# Patient Record
Sex: Male | Born: 1960
Health system: Southern US, Community
[De-identification: ages and names within clinical notes are randomized; demographics above are authoritative.]

## PROBLEM LIST (undated history)

## (undated) DIAGNOSIS — L739 Follicular disorder, unspecified: Secondary | ICD-10-CM

## (undated) DIAGNOSIS — G43009 Migraine without aura, not intractable, without status migrainosus: Secondary | ICD-10-CM

## (undated) DIAGNOSIS — N529 Male erectile dysfunction, unspecified: Secondary | ICD-10-CM

## (undated) DIAGNOSIS — F325 Major depressive disorder, single episode, in full remission: Secondary | ICD-10-CM

## (undated) DIAGNOSIS — K635 Polyp of colon: Secondary | ICD-10-CM

## (undated) DIAGNOSIS — G4700 Insomnia, unspecified: Secondary | ICD-10-CM

## (undated) DIAGNOSIS — F411 Generalized anxiety disorder: Secondary | ICD-10-CM

## (undated) DIAGNOSIS — E782 Mixed hyperlipidemia: Secondary | ICD-10-CM

## (undated) DIAGNOSIS — J302 Other seasonal allergic rhinitis: Principal | ICD-10-CM

## (undated) DIAGNOSIS — Z131 Encounter for screening for diabetes mellitus: Secondary | ICD-10-CM

## (undated) DIAGNOSIS — M545 Low back pain: Secondary | ICD-10-CM

## (undated) DIAGNOSIS — K5790 Diverticulosis of intestine, part unspecified, without perforation or abscess without bleeding: Secondary | ICD-10-CM

## (undated) DIAGNOSIS — R0683 Snoring: Secondary | ICD-10-CM

## (undated) DIAGNOSIS — N401 Enlarged prostate with lower urinary tract symptoms: Secondary | ICD-10-CM

## (undated) DIAGNOSIS — R6882 Decreased libido: Secondary | ICD-10-CM

## (undated) DIAGNOSIS — F419 Anxiety disorder, unspecified: Secondary | ICD-10-CM

## (undated) DIAGNOSIS — E7800 Pure hypercholesterolemia, unspecified: Secondary | ICD-10-CM

## (undated) HISTORY — DX: Snoring: R06.83

## (undated) HISTORY — DX: Migraine without aura, not intractable, without status migrainosus: G43.009

## (undated) HISTORY — DX: Mixed hyperlipidemia: E78.2

## (undated) HISTORY — DX: Follicular disorder, unspecified: L73.9

## (undated) HISTORY — DX: Pure hypercholesterolemia, unspecified: E78.00

## (undated) HISTORY — DX: Major depressive disorder, single episode, in full remission: F32.5

## (undated) HISTORY — DX: Encounter for screening for diabetes mellitus: Z13.1

## (undated) HISTORY — DX: Decreased libido: R68.82

## (undated) HISTORY — DX: Insomnia, unspecified: G47.00

## (undated) HISTORY — DX: Other seasonal allergic rhinitis: J30.2

## (undated) HISTORY — DX: Anxiety disorder, unspecified: F41.9

## (undated) HISTORY — DX: Benign prostatic hyperplasia with lower urinary tract symptoms: N40.1

## (undated) HISTORY — DX: Polyp of colon: K63.5

## (undated) HISTORY — DX: Generalized anxiety disorder: F41.1

## (undated) HISTORY — DX: Diverticulosis of intestine, part unspecified, without perforation or abscess without bleeding: K57.90

## (undated) HISTORY — DX: Low back pain: M54.5

## (undated) HISTORY — DX: Male erectile dysfunction, unspecified: N52.9

---

## 2008-11-07 ENCOUNTER — Encounter: Admission: RE | Admit: 2008-11-07 | Discharge: 2008-11-07 | Payer: Self-pay | Admitting: Emergency Medicine

## 2010-09-26 ENCOUNTER — Encounter: Payer: Self-pay | Admitting: Emergency Medicine

## 2010-10-26 ENCOUNTER — Ambulatory Visit: Payer: Self-pay

## 2015-06-01 DIAGNOSIS — K5790 Diverticulosis of intestine, part unspecified, without perforation or abscess without bleeding: Secondary | ICD-10-CM

## 2015-06-01 DIAGNOSIS — L739 Follicular disorder, unspecified: Secondary | ICD-10-CM

## 2015-06-01 DIAGNOSIS — N529 Male erectile dysfunction, unspecified: Secondary | ICD-10-CM

## 2015-06-01 DIAGNOSIS — R6882 Decreased libido: Secondary | ICD-10-CM

## 2015-06-01 DIAGNOSIS — M545 Low back pain, unspecified: Secondary | ICD-10-CM

## 2015-06-01 DIAGNOSIS — G4700 Insomnia, unspecified: Secondary | ICD-10-CM

## 2015-06-01 DIAGNOSIS — F411 Generalized anxiety disorder: Secondary | ICD-10-CM

## 2015-06-01 DIAGNOSIS — G43009 Migraine without aura, not intractable, without status migrainosus: Secondary | ICD-10-CM | POA: Insufficient documentation

## 2015-06-01 DIAGNOSIS — K635 Polyp of colon: Secondary | ICD-10-CM

## 2015-06-01 DIAGNOSIS — F419 Anxiety disorder, unspecified: Secondary | ICD-10-CM

## 2015-06-01 DIAGNOSIS — E7800 Pure hypercholesterolemia, unspecified: Secondary | ICD-10-CM

## 2015-06-01 DIAGNOSIS — N401 Enlarged prostate with lower urinary tract symptoms: Secondary | ICD-10-CM

## 2015-06-01 DIAGNOSIS — G43909 Migraine, unspecified, not intractable, without status migrainosus: Secondary | ICD-10-CM

## 2015-06-01 DIAGNOSIS — R0683 Snoring: Secondary | ICD-10-CM | POA: Insufficient documentation

## 2015-06-01 DIAGNOSIS — F325 Major depressive disorder, single episode, in full remission: Secondary | ICD-10-CM | POA: Insufficient documentation

## 2015-06-01 DIAGNOSIS — J302 Other seasonal allergic rhinitis: Secondary | ICD-10-CM

## 2015-06-01 DIAGNOSIS — E782 Mixed hyperlipidemia: Secondary | ICD-10-CM

## 2015-06-01 DIAGNOSIS — Z131 Encounter for screening for diabetes mellitus: Secondary | ICD-10-CM | POA: Insufficient documentation

## 2015-06-01 HISTORY — DX: Decreased libido: R68.82

## 2015-06-01 HISTORY — DX: Diverticulosis of intestine, part unspecified, without perforation or abscess without bleeding: K57.90

## 2015-06-01 HISTORY — DX: Other seasonal allergic rhinitis: J30.2

## 2015-06-01 HISTORY — DX: Male erectile dysfunction, unspecified: N52.9

## 2015-06-01 HISTORY — DX: Anxiety disorder, unspecified: F41.9

## 2015-06-01 HISTORY — DX: Polyp of colon: K63.5

## 2015-06-01 HISTORY — DX: Low back pain, unspecified: M54.50

## 2015-06-01 HISTORY — DX: Pure hypercholesterolemia, unspecified: E78.00

## 2015-06-01 HISTORY — DX: Generalized anxiety disorder: F41.1

## 2015-06-04 ENCOUNTER — Ambulatory Visit (INDEPENDENT_AMBULATORY_CARE_PROVIDER_SITE_OTHER): Payer: BLUE CROSS/BLUE SHIELD | Admitting: Internal Medicine

## 2015-06-04 ENCOUNTER — Encounter: Payer: Self-pay | Admitting: Internal Medicine

## 2015-06-04 VITALS — BP 108/84 | HR 53 | Ht 66.0 in | Wt 150.0 lb

## 2015-06-04 DIAGNOSIS — R001 Bradycardia, unspecified: Secondary | ICD-10-CM

## 2015-06-04 NOTE — Progress Notes (Signed)
HPI Tyler Hickman is referred today for evaluation of bradycardia. He is a pleasant middle aged man with a h/o migraine HA's and nausea from a pronounced gag reflex. He has otherwise had good health. He has been known at time to be bradycardic in the doctors office. He has never had syncope. He has seen a trainer and noted to have relative bradycardia on a 3 min step test. The patient exercises over 5 times a week, walking on the treadmill and 4 mph with an incline. He tolerates this well. He has no family history of syncope. His brother has sleep apnea. He takes his pulse on occaision and has caught it under 50/min. No other complaints. His migraines have been treated with liquid ibuprofen and Zofran. No Known Allergies   Current Outpatient Prescriptions  Medication Sig Dispense Refill  . ALPRAZolam (XANAX) 0.5 MG tablet Take 0.5 mg by mouth 2 (two) times daily as needed for anxiety (sleep).     Marland Kitchen buPROPion (WELLBUTRIN XL) 150 MG 24 hr tablet Take 300 mg by mouth daily.     . Calcium Carbonate (CALCIUM 600 PO) Take 2 tablets by mouth daily.    . cetirizine (ZYRTEC) 10 MG tablet Take 10 mg by mouth daily.    . Chlorpheniramine-DM (ROBITUSSIN COUGH/COLD LONG-ACT) 2-15 MG/5ML LIQD Take by mouth as directed. Take by mouth as directed    . cyclobenzaprine (FLEXERIL) 10 MG tablet Take 10 mg by mouth 3 (three) times daily as needed for muscle spasms (muscle spasms).    . docusate sodium (COLACE) 100 MG capsule Take 100 mg by mouth 2 (two) times daily.    . fluticasone (FLONASE) 50 MCG/ACT nasal spray Place 2 sprays into both nostrils daily.    Marland Kitchen guaiFENesin (MUCINEX) 600 MG 12 hr tablet Take 1,200 mg by mouth 2 (two) times daily as needed (cough).    Marland Kitchen ibuprofen (ADVIL,MOTRIN) 100 MG/5ML suspension Take 200 mg by mouth every 8 (eight) hours as needed (migraine).    Marland Kitchen ketotifen (ALAWAY) 0.025 % ophthalmic solution Apply 1 drop to eye 2 (two) times daily.    . montelukast (SINGULAIR) 10 MG tablet  Take 10 mg by mouth at bedtime.    . Multiple Vitamin (MULTI VITAMIN DAILY PO) Take 2 tablets by mouth daily.    . naproxen sodium (ANAPROX) 220 MG tablet Take 220 mg by mouth 2 (two) times daily as needed (pain).    . ondansetron (ZOFRAN) 4 MG tablet Take 4 mg by mouth every 8 (eight) hours as needed for nausea or vomiting (migraine nausea).    Marland Kitchen oxymetazoline (AFRIN) 0.05 % nasal spray Place 3 sprays into both nostrils 2 (two) times daily as needed for congestion (congestion).    . polyethylene glycol (MIRALAX / GLYCOLAX) packet Take 17 g by mouth daily as needed (constipation).    . pseudoephedrine (SUDAFED) 30 MG tablet Take 30 mg by mouth every 6 (six) hours as needed for congestion (congestion).    . sildenafil (VIAGRA) 50 MG tablet Take 50 mg by mouth daily as needed for erectile dysfunction (erectile dysfunction).    . Sodium Chloride-Sodium Bicarb (AYR SALINE NASAL RINSE) 1.57 G PACK Place into the nose daily as needed (nasal irrigation).    . zaleplon (SONATA) 10 MG capsule Take 10 mg by mouth at bedtime as needed for sleep (insomnia of midnight awakenings).     No current facility-administered medications for this visit.     Past Medical History  Diagnosis Date  .  Seasonal allergic rhinitis 06/01/2015  . Elevated cholesterol 06/01/2015  . Recurrent low back pain 06/01/2015  . Low libido 06/01/2015  . Erectile dysfunction 06/01/2015  . Low libido 06/01/2015  . GAD (generalized anxiety disorder) 06/01/2015  . Anxiety 06/01/2015  . Diverticulosis 06/01/2015  . Hyperplastic colon polyp 06/01/2015  . Mixed hyperlipidemia   . Enlarged prostate with lower urinary tract symptoms (LUTS)   . Migraine without aura and without status migrainosus, not intractable   . Insomnia   . Major depressive disorder in remission   . Snoring   . Folliculitis   . Screening for diabetes mellitus     ROS:   All systems reviewed and negative except as noted in the HPI.   No past surgical history on  file.   Family History  Problem Relation Age of Onset  . Ovarian cancer Mother   . Breast cancer Mother   . Hypertension Brother   . Sleep apnea Brother   . Healthy Daughter   . Healthy Son   . Brain cancer Father      Social History   Social History  . Marital Status: Married    Spouse Name: N/A  . Number of Children: N/A  . Years of Education: N/A   Occupational History  . Not on file.   Social History Main Topics  . Smoking status: Never Smoker   . Smokeless tobacco: Never Used  . Alcohol Use: 0.6 oz/week    1 Glasses of wine, 0 Cans of beer per week  . Drug Use: No  . Sexual Activity: Not on file   Other Topics Concern  . Not on file   Social History Narrative     BP 108/84 mmHg  Pulse 53  Ht  (1.676 m)  Wt 150 lb (68.04 kg)  BMI 24.22 kg/m2  SpO2 97%  Physical Exam:  Well appearing middle aged man, NAD HEENT: Unremarkable Neck:  7 cm JVD, no thyromegally Lymphatics:  No adenopathy Back:  No CVA tenderness Lungs:  Clear with no wheezes HEART:  Regular rate rhythm, no murmurs, no rubs, no clicks Abd:  soft, positive bowel sounds, no organomegally, no rebound, no guarding Ext:  2 plus pulses, no edema, no cyanosis, no clubbing Skin:  No rashes no nodules Neuro:  CN II through XII intact, motor grossly intact  EKG - nsr   Assess/Plan:

## 2015-06-04 NOTE — Patient Instructions (Signed)
Medication Instructions:  Your physician recommends that you continue on your current medications as directed. Please refer to the Current Medication list given to you today.   Labwork: None ordered  Testing/Procedures: None ordered  Follow-Up: Your physician recommends that you schedule a follow-up appointment as needed with Dr Taylor   Any Other Special Instructions Will Be Listed Below (If Applicable).   

## 2015-06-04 NOTE — Assessment & Plan Note (Signed)
He appears to be asymptomatic at this time. I have discussed the benign nature of his sinus bradycardia. I have encouraged him to continue his vigorous physical activity. No indication for PPM insertion at this point in time.

## 2015-06-26 ENCOUNTER — Encounter: Payer: Self-pay | Admitting: Internal Medicine

## 2015-07-01 ENCOUNTER — Ambulatory Visit: Payer: Self-pay | Admitting: Cardiology

## 2015-12-22 DIAGNOSIS — E7800 Pure hypercholesterolemia, unspecified: Secondary | ICD-10-CM | POA: Diagnosis not present

## 2015-12-22 DIAGNOSIS — Z0000 Encounter for general adult medical examination without abnormal findings: Secondary | ICD-10-CM | POA: Diagnosis not present

## 2015-12-22 DIAGNOSIS — Z125 Encounter for screening for malignant neoplasm of prostate: Secondary | ICD-10-CM | POA: Diagnosis not present

## 2015-12-22 DIAGNOSIS — G43009 Migraine without aura, not intractable, without status migrainosus: Secondary | ICD-10-CM | POA: Diagnosis not present

## 2015-12-24 DIAGNOSIS — F329 Major depressive disorder, single episode, unspecified: Secondary | ICD-10-CM | POA: Diagnosis not present

## 2016-02-05 DIAGNOSIS — J029 Acute pharyngitis, unspecified: Secondary | ICD-10-CM | POA: Diagnosis not present

## 2016-02-05 DIAGNOSIS — L659 Nonscarring hair loss, unspecified: Secondary | ICD-10-CM | POA: Diagnosis not present

## 2016-02-18 DIAGNOSIS — F329 Major depressive disorder, single episode, unspecified: Secondary | ICD-10-CM | POA: Diagnosis not present

## 2016-02-19 DIAGNOSIS — Z91018 Allergy to other foods: Secondary | ICD-10-CM | POA: Diagnosis not present

## 2016-02-19 DIAGNOSIS — J309 Allergic rhinitis, unspecified: Secondary | ICD-10-CM | POA: Diagnosis not present

## 2016-02-19 DIAGNOSIS — H1010 Acute atopic conjunctivitis, unspecified eye: Secondary | ICD-10-CM | POA: Diagnosis not present

## 2016-02-19 DIAGNOSIS — L209 Atopic dermatitis, unspecified: Secondary | ICD-10-CM | POA: Diagnosis not present

## 2016-03-29 DIAGNOSIS — F330 Major depressive disorder, recurrent, mild: Secondary | ICD-10-CM | POA: Diagnosis not present

## 2016-04-05 DIAGNOSIS — F330 Major depressive disorder, recurrent, mild: Secondary | ICD-10-CM | POA: Diagnosis not present

## 2016-04-20 DIAGNOSIS — F330 Major depressive disorder, recurrent, mild: Secondary | ICD-10-CM | POA: Diagnosis not present

## 2016-04-28 DIAGNOSIS — H5213 Myopia, bilateral: Secondary | ICD-10-CM | POA: Diagnosis not present

## 2016-05-02 DIAGNOSIS — F411 Generalized anxiety disorder: Secondary | ICD-10-CM | POA: Diagnosis not present

## 2016-05-02 DIAGNOSIS — F330 Major depressive disorder, recurrent, mild: Secondary | ICD-10-CM | POA: Diagnosis not present

## 2016-05-16 DIAGNOSIS — F411 Generalized anxiety disorder: Secondary | ICD-10-CM | POA: Diagnosis not present

## 2016-05-16 DIAGNOSIS — F330 Major depressive disorder, recurrent, mild: Secondary | ICD-10-CM | POA: Diagnosis not present

## 2016-05-23 DIAGNOSIS — H5213 Myopia, bilateral: Secondary | ICD-10-CM | POA: Diagnosis not present

## 2016-05-30 DIAGNOSIS — F330 Major depressive disorder, recurrent, mild: Secondary | ICD-10-CM | POA: Diagnosis not present

## 2016-05-30 DIAGNOSIS — F411 Generalized anxiety disorder: Secondary | ICD-10-CM | POA: Diagnosis not present

## 2016-06-06 DIAGNOSIS — F411 Generalized anxiety disorder: Secondary | ICD-10-CM | POA: Diagnosis not present

## 2016-06-06 DIAGNOSIS — F330 Major depressive disorder, recurrent, mild: Secondary | ICD-10-CM | POA: Diagnosis not present

## 2016-06-15 DIAGNOSIS — F330 Major depressive disorder, recurrent, mild: Secondary | ICD-10-CM | POA: Diagnosis not present

## 2016-06-15 DIAGNOSIS — F411 Generalized anxiety disorder: Secondary | ICD-10-CM | POA: Diagnosis not present

## 2016-06-23 DIAGNOSIS — F330 Major depressive disorder, recurrent, mild: Secondary | ICD-10-CM | POA: Diagnosis not present

## 2016-06-23 DIAGNOSIS — F411 Generalized anxiety disorder: Secondary | ICD-10-CM | POA: Diagnosis not present

## 2016-06-24 DIAGNOSIS — Z125 Encounter for screening for malignant neoplasm of prostate: Secondary | ICD-10-CM | POA: Diagnosis not present

## 2016-06-24 DIAGNOSIS — F430 Acute stress reaction: Secondary | ICD-10-CM | POA: Diagnosis not present

## 2016-06-24 DIAGNOSIS — E7800 Pure hypercholesterolemia, unspecified: Secondary | ICD-10-CM | POA: Diagnosis not present

## 2016-06-24 DIAGNOSIS — F329 Major depressive disorder, single episode, unspecified: Secondary | ICD-10-CM | POA: Diagnosis not present

## 2016-06-24 DIAGNOSIS — Z23 Encounter for immunization: Secondary | ICD-10-CM | POA: Diagnosis not present

## 2016-06-24 DIAGNOSIS — N529 Male erectile dysfunction, unspecified: Secondary | ICD-10-CM | POA: Diagnosis not present

## 2016-07-04 DIAGNOSIS — F411 Generalized anxiety disorder: Secondary | ICD-10-CM | POA: Diagnosis not present

## 2016-07-04 DIAGNOSIS — F330 Major depressive disorder, recurrent, mild: Secondary | ICD-10-CM | POA: Diagnosis not present

## 2016-08-03 DIAGNOSIS — J069 Acute upper respiratory infection, unspecified: Secondary | ICD-10-CM | POA: Diagnosis not present

## 2016-08-18 DIAGNOSIS — F330 Major depressive disorder, recurrent, mild: Secondary | ICD-10-CM | POA: Diagnosis not present

## 2016-08-24 DIAGNOSIS — F330 Major depressive disorder, recurrent, mild: Secondary | ICD-10-CM | POA: Diagnosis not present

## 2016-09-01 DIAGNOSIS — F330 Major depressive disorder, recurrent, mild: Secondary | ICD-10-CM | POA: Diagnosis not present

## 2016-09-12 DIAGNOSIS — F330 Major depressive disorder, recurrent, mild: Secondary | ICD-10-CM | POA: Diagnosis not present

## 2016-09-19 DIAGNOSIS — F330 Major depressive disorder, recurrent, mild: Secondary | ICD-10-CM | POA: Diagnosis not present

## 2016-09-28 DIAGNOSIS — F330 Major depressive disorder, recurrent, mild: Secondary | ICD-10-CM | POA: Diagnosis not present

## 2016-10-04 DIAGNOSIS — F330 Major depressive disorder, recurrent, mild: Secondary | ICD-10-CM | POA: Diagnosis not present

## 2016-10-24 DIAGNOSIS — F330 Major depressive disorder, recurrent, mild: Secondary | ICD-10-CM | POA: Diagnosis not present

## 2016-10-31 DIAGNOSIS — F330 Major depressive disorder, recurrent, mild: Secondary | ICD-10-CM | POA: Diagnosis not present

## 2016-11-15 DIAGNOSIS — F330 Major depressive disorder, recurrent, mild: Secondary | ICD-10-CM | POA: Diagnosis not present

## 2016-11-22 DIAGNOSIS — F330 Major depressive disorder, recurrent, mild: Secondary | ICD-10-CM | POA: Diagnosis not present

## 2016-11-30 DIAGNOSIS — F330 Major depressive disorder, recurrent, mild: Secondary | ICD-10-CM | POA: Diagnosis not present

## 2016-12-12 DIAGNOSIS — F330 Major depressive disorder, recurrent, mild: Secondary | ICD-10-CM | POA: Diagnosis not present

## 2016-12-19 DIAGNOSIS — F330 Major depressive disorder, recurrent, mild: Secondary | ICD-10-CM | POA: Diagnosis not present

## 2016-12-26 DIAGNOSIS — G4700 Insomnia, unspecified: Secondary | ICD-10-CM | POA: Diagnosis not present

## 2016-12-26 DIAGNOSIS — F329 Major depressive disorder, single episode, unspecified: Secondary | ICD-10-CM | POA: Diagnosis not present

## 2016-12-26 DIAGNOSIS — G43009 Migraine without aura, not intractable, without status migrainosus: Secondary | ICD-10-CM | POA: Diagnosis not present

## 2016-12-26 DIAGNOSIS — Z125 Encounter for screening for malignant neoplasm of prostate: Secondary | ICD-10-CM | POA: Diagnosis not present

## 2016-12-26 DIAGNOSIS — N529 Male erectile dysfunction, unspecified: Secondary | ICD-10-CM | POA: Diagnosis not present

## 2016-12-26 DIAGNOSIS — Z0000 Encounter for general adult medical examination without abnormal findings: Secondary | ICD-10-CM | POA: Diagnosis not present

## 2016-12-26 DIAGNOSIS — E7800 Pure hypercholesterolemia, unspecified: Secondary | ICD-10-CM | POA: Diagnosis not present

## 2017-01-02 DIAGNOSIS — F330 Major depressive disorder, recurrent, mild: Secondary | ICD-10-CM | POA: Diagnosis not present

## 2017-01-09 DIAGNOSIS — F330 Major depressive disorder, recurrent, mild: Secondary | ICD-10-CM | POA: Diagnosis not present

## 2017-01-23 DIAGNOSIS — F330 Major depressive disorder, recurrent, mild: Secondary | ICD-10-CM | POA: Diagnosis not present

## 2017-02-06 DIAGNOSIS — F330 Major depressive disorder, recurrent, mild: Secondary | ICD-10-CM | POA: Diagnosis not present

## 2017-02-19 ENCOUNTER — Emergency Department (HOSPITAL_COMMUNITY): Payer: BLUE CROSS/BLUE SHIELD

## 2017-02-19 ENCOUNTER — Encounter (HOSPITAL_COMMUNITY): Payer: Self-pay | Admitting: Emergency Medicine

## 2017-02-19 ENCOUNTER — Emergency Department (HOSPITAL_COMMUNITY)
Admission: EM | Admit: 2017-02-19 | Discharge: 2017-02-19 | Disposition: A | Discharge status: Home / Self Care | Payer: BLUE CROSS/BLUE SHIELD | Attending: Emergency Medicine | Admitting: Emergency Medicine

## 2017-02-19 DIAGNOSIS — K5792 Diverticulitis of intestine, part unspecified, without perforation or abscess without bleeding: Secondary | ICD-10-CM | POA: Diagnosis not present

## 2017-02-19 DIAGNOSIS — K5732 Diverticulitis of large intestine without perforation or abscess without bleeding: Secondary | ICD-10-CM | POA: Diagnosis not present

## 2017-02-19 DIAGNOSIS — R1030 Lower abdominal pain, unspecified: Secondary | ICD-10-CM | POA: Diagnosis not present

## 2017-02-19 LAB — COMPREHENSIVE METABOLIC PANEL
ALBUMIN: 3.9 g/dL (ref 3.5–5.0)
ALT: 24 U/L (ref 17–63)
ANION GAP: 10 (ref 5–15)
AST: 28 U/L (ref 15–41)
Alkaline Phosphatase: 76 U/L (ref 38–126)
BUN: 15 mg/dL (ref 6–20)
CALCIUM: 10.1 mg/dL (ref 8.9–10.3)
CHLORIDE: 105 mmol/L (ref 101–111)
CO2: 22 mmol/L (ref 22–32)
Creatinine, Ser: 1.02 mg/dL (ref 0.61–1.24)
GFR calc non Af Amer: >60 mL/min (ref >60)
GLUCOSE: 113 mg/dL — AB (ref 65–99)
POTASSIUM: 3.8 mmol/L (ref 3.5–5.1)
SODIUM: 137 mmol/L (ref 135–145)
Total Bilirubin: 0.7 mg/dL (ref 0.3–1.2)
Total Protein: 6.6 g/dL (ref 6.5–8.1)

## 2017-02-19 LAB — CBC
HEMATOCRIT: 44.5 % (ref 39.0–52.0)
HEMOGLOBIN: 15.5 g/dL (ref 13.0–17.0)
MCH: 32.4 pg (ref 26.0–34.0)
MCHC: 34.8 g/dL (ref 30.0–36.0)
MCV: 92.9 fL (ref 78.0–100.0)
Platelets: 178 10*3/uL (ref 150–400)
RBC: 4.79 MIL/uL (ref 4.22–5.81)
RDW: 12.4 % (ref 11.5–15.5)
WBC: 11.8 10*3/uL — ABNORMAL HIGH (ref 4.0–10.5)

## 2017-02-19 LAB — URINALYSIS, ROUTINE W REFLEX MICROSCOPIC
Bilirubin Urine: NEGATIVE
Glucose, UA: NEGATIVE mg/dL
HGB URINE DIPSTICK: NEGATIVE
Ketones, ur: NEGATIVE mg/dL
Leukocytes, UA: NEGATIVE
Nitrite: NEGATIVE
PH: 7 (ref 5.0–8.0)
Protein, ur: NEGATIVE mg/dL
SPECIFIC GRAVITY, URINE: 1.043 — AB (ref 1.005–1.030)

## 2017-02-19 LAB — LIPASE, BLOOD: LIPASE: 38 U/L (ref 11–51)

## 2017-02-19 MED ORDER — MORPHINE SULFATE (PF) 4 MG/ML IV SOLN
4.0000 mg | Freq: Once | INTRAVENOUS | Status: AC
  Administered 2017-02-19: 4 mg via INTRAVENOUS
  Filled 2017-02-19: qty 1

## 2017-02-19 MED ORDER — OXYCODONE-ACETAMINOPHEN 5-325 MG PO TABS: 1.0000 | ORAL_TABLET | ORAL | 0 refills | Status: DC | PRN

## 2017-02-19 MED ORDER — IOPAMIDOL (ISOVUE-300) INJECTION 61%
INTRAVENOUS | Status: AC
  Administered 2017-02-19: 100 mL
  Filled 2017-02-19: qty 100

## 2017-02-19 MED ORDER — METRONIDAZOLE 500 MG PO TABS
500.0000 mg | ORAL_TABLET | Freq: Once | ORAL | Status: AC
  Administered 2017-02-19: 500 mg via ORAL
  Filled 2017-02-19: qty 1

## 2017-02-19 MED ORDER — CIPROFLOXACIN HCL 500 MG PO TABS
500.0000 mg | ORAL_TABLET | Freq: Once | ORAL | Status: AC
  Administered 2017-02-19: 500 mg via ORAL
  Filled 2017-02-19: qty 1

## 2017-02-19 MED ORDER — METRONIDAZOLE 500 MG PO TABS: 500.0000 mg | ORAL_TABLET | Freq: Three times a day (TID) | ORAL | 0 refills | Status: AC

## 2017-02-19 MED ORDER — ONDANSETRON HCL 4 MG/2ML IJ SOLN
4.0000 mg | Freq: Once | INTRAMUSCULAR | Status: AC
  Administered 2017-02-19: 4 mg via INTRAVENOUS
  Filled 2017-02-19: qty 2

## 2017-02-19 MED ORDER — CIPROFLOXACIN HCL 500 MG PO TABS: 500.0000 mg | ORAL_TABLET | Freq: Two times a day (BID) | ORAL | 0 refills | Status: AC

## 2017-02-19 NOTE — ED Notes (Signed)
Patient states that he cannot keep anything down.  Feels like this might be the start of a migraine but has only belly pain.

## 2017-02-19 NOTE — ED Provider Notes (Signed)
[facility]-EMERGENCY DEPT Provider Note   CSN: [id number] Arrival date & time: 02/19/17  1143     History   Chief Complaint Chief Complaint  Patient presents with  . Abdominal Pain  . Nausea    HPI   Blood pressure 114/74, pulse (!) 58, temperature 98.2 F (36.8 C), resp. rate (!) 25, height 5' 5.5" (1.664 m), weight 68 kg (150 lb), SpO2 100 %.  Tyler Hickman is a 56 y.o. male complaining of severe lower abdominal pain onset this morning with tingling in his hands and sensation that he needs to urinate and defecate when he tries to use the restroom and happens. He denies fevers, chills,  vomiting, anticoagulation, melena, hematochezia, previous abdominal surgeries.    Past Medical History:  Diagnosis Date  . Anxiety 06/01/2015  . Diverticulosis 06/01/2015  . Elevated cholesterol 06/01/2015  . Enlarged prostate with lower urinary tract symptoms (LUTS)   . Erectile dysfunction 06/01/2015  . Folliculitis   . GAD (generalized anxiety disorder) 06/01/2015  . Hyperplastic colon polyp 06/01/2015  . Insomnia   . Low libido 06/01/2015  . Low libido 06/01/2015  . Major depressive disorder in remission (HCC)   . Migraine without aura and without status migrainosus, not intractable   . Mixed hyperlipidemia   . Recurrent low back pain 06/01/2015  . Screening for diabetes mellitus   . Seasonal allergic rhinitis 06/01/2015  . Snoring     Patient Active Problem List   Diagnosis Date Noted  . Sinus bradycardia 06/04/2015  . Seasonal allergic rhinitis 06/01/2015  . Elevated cholesterol 06/01/2015  . Recurrent low back pain 06/01/2015  . Low libido 06/01/2015  . Erectile dysfunction 06/01/2015  . GAD (generalized anxiety disorder) 06/01/2015  . Anxiety 06/01/2015  . Diverticulosis 06/01/2015  . Hyperplastic colon polyp 06/01/2015  . Migraines 06/01/2015  . Screening for diabetes mellitus   . Folliculitis   . Snoring   . Major depressive disorder in remission (HCC)   . Insomnia   .  Migraine without aura and without status migrainosus, not intractable   . Enlarged prostate with lower urinary tract symptoms (LUTS)   . Mixed hyperlipidemia     History reviewed. No pertinent surgical history.     Home Medications    Prior to Admission medications   Medication Sig Start Date End Date Taking? Authorizing Provider  ALPRAZolam Prudy Feeler(XANAX) 0.5 MG tablet Take 0.5 mg by mouth 2 (two) times daily as needed for anxiety (sleep).     [provider]  buPROPion (WELLBUTRIN XL) 150 MG 24 hr tablet Take 300 mg by mouth daily.     [provider]  Calcium Carbonate (CALCIUM 600 PO) Take 2 tablets by mouth daily.    [provider]  cetirizine (ZYRTEC) 10 MG tablet Take 10 mg by mouth daily.    [provider]  Chlorpheniramine-DM (ROBITUSSIN COUGH/COLD LONG-ACT) 2-15 MG/5ML LIQD Take by mouth as directed. Take by mouth as directed    [provider]  ciprofloxacin (CIPRO) 500 MG tablet Take 1 tablet (500 mg total) by mouth every 12 (twelve) hours. 02/19/17   Adir Schicker, Joni ReiningNicole, PA-C  cyclobenzaprine (FLEXERIL) 10 MG tablet Take 10 mg by mouth 3 (three) times daily as needed for muscle spasms (muscle spasms).    [provider]  docusate sodium (COLACE) 100 MG capsule Take 100 mg by mouth 2 (two) times daily.    [provider]  fluticasone (FLONASE) 50 MCG/ACT nasal spray Place 2 sprays into both nostrils daily.  [provider]  guaiFENesin (MUCINEX) 600 MG 12 hr tablet Take 1,200 mg by mouth 2 (two) times daily as needed (cough).    [provider]  ibuprofen (ADVIL,MOTRIN) 100 MG/5ML suspension Take 200 mg by mouth every 8 (eight) hours as needed (migraine).    [provider]  ketotifen (ALAWAY) 0.025 % ophthalmic solution Apply 1 drop to eye 2 (two) times daily.    [provider]  metroNIDAZOLE (FLAGYL) 500 MG tablet Take 1 tablet (500 mg total) by mouth 3 (three) times daily. One tab PO  bid x 10 days 02/19/17 03/01/17  Izza Bickle, Joni Reining, PA-C  montelukast (SINGULAIR) 10 MG tablet Take 10 mg by mouth at bedtime.    [provider]  Multiple Vitamin (MULTI VITAMIN DAILY PO) Take 2 tablets by mouth daily.    [provider]  naproxen sodium (ANAPROX) 220 MG tablet Take 220 mg by mouth 2 (two) times daily as needed (pain).    [provider]  ondansetron (ZOFRAN) 4 MG tablet Take 4 mg by mouth every 8 (eight) hours as needed for nausea or vomiting (migraine nausea).    [provider]  oxyCODONE-acetaminophen (PERCOCET) 5-325 MG tablet Take 1 tablet by mouth every 4 (four) hours as needed. 02/19/17   Kynzlee Hucker, Joni Reining, PA-C  oxymetazoline (AFRIN) 0.05 % nasal spray Place 3 sprays into both nostrils 2 (two) times daily as needed for congestion (congestion).    [provider]  polyethylene glycol (MIRALAX / GLYCOLAX) packet Take 17 g by mouth daily as needed (constipation).    [provider]  pseudoephedrine (SUDAFED) 30 MG tablet Take 30 mg by mouth every 6 (six) hours as needed for congestion (congestion).    [provider]  sildenafil (VIAGRA) 50 MG tablet Take 50 mg by mouth daily as needed for erectile dysfunction (erectile dysfunction).    [provider]  Sodium Chloride-Sodium Bicarb (AYR SALINE NASAL RINSE) 1.57 G PACK Place into the nose daily as needed (nasal irrigation).    [provider]  zaleplon (SONATA) 10 MG capsule Take 10 mg by mouth at bedtime as needed for sleep (insomnia of midnight awakenings).    [provider]    Family History Family History  Problem Relation Age of Onset  . Ovarian cancer Mother   . Breast cancer Mother   . Hypertension Brother   . Sleep apnea Brother   . Healthy Daughter   . Healthy Son   . Brain cancer Father     Social History Social History  Substance Use Topics  . Smoking status: Never Smoker  . Smokeless tobacco: Never Used  . Alcohol  use 0.6 oz/week    1 Glasses of wine per week     Allergies   Patient has no known allergies.   Review of Systems Review of Systems  A complete review of systems was obtained and all systems are negative except as noted in the HPI and PMH.    Physical Exam Updated Vital Signs BP 115/64   Pulse 70   Temp 98.2 F (36.8 C)   Resp (!) 25   Ht 5' 5.5" (1.664 m)   Wt 68 kg (150 lb)   SpO2 95%   BMI 24.58 kg/m   Physical Exam  Constitutional: He is oriented to person, place, and time. He appears well-developed and well-nourished. No distress.  Appears uncomfortable  HENT:  Head: Normocephalic and atraumatic.  Mouth/Throat: Oropharynx is clear and moist.  Eyes: Conjunctivae and EOM  are normal. Pupils are equal, round, and reactive to light.  Neck: Normal range of motion.  Cardiovascular: Normal rate, regular rhythm and intact distal pulses.   Pulmonary/Chest: Effort normal and breath sounds normal.  Abdominal: Soft. There is tenderness.  Hypoactive bowel sounds, diffusely tender to palpation most focally in the right lower left lower quadrant, Rovsing, psoas an obturator are negative.  Musculoskeletal: Normal range of motion.  Neurological: He is alert and oriented to person, place, and time.  Skin: He is not diaphoretic.  Psychiatric: He has a normal mood and affect.  Nursing note and vitals reviewed.    ED Treatments / Results  Labs (all labs ordered are listed, but only abnormal results are displayed) Labs Reviewed  COMPREHENSIVE METABOLIC PANEL - Abnormal; Notable for the following:       Result Value   Glucose, Bld 113 (*)    All other components within normal limits  CBC - Abnormal; Notable for the following:    WBC 11.8 (*)    All other components within normal limits  URINALYSIS, ROUTINE W REFLEX MICROSCOPIC - Abnormal; Notable for the following:    Specific Gravity, Urine 1.043 (*)    All other components within normal limits  LIPASE, BLOOD    EKG   EKG Interpretation None       Radiology Ct Abdomen Pelvis W Contrast  Result Date: 02/19/2017 CLINICAL DATA:  56 year old male with a history of lower abdominal pain EXAM: CT ABDOMEN AND PELVIS WITH CONTRAST TECHNIQUE: Multidetector CT imaging of the abdomen and pelvis was performed using the standard protocol following bolus administration of intravenous contrast. CONTRAST:  ISOVUE-300 IOPAMIDOL (ISOVUE-300) INJECTION 61% COMPARISON:  None. FINDINGS: Lower chest: No acute abnormality. Hepatobiliary: No focal liver abnormality is seen. No gallstones, gallbladder wall thickening, or biliary dilatation. Pancreas: Unremarkable. No pancreatic ductal dilatation or surrounding inflammatory changes. Spleen: Normal in size without focal abnormality. Adrenals/Urinary Tract: Adrenal glands are unremarkable. Kidneys are normal, without renal calculi, focal lesion, or hydronephrosis. Bladder is unremarkable. Stomach/Bowel: Short segment of circumferential thickening of the sigmoid colon with inflammatory changes of the adjacent fat and mild peritoneal thickening/ peritoneal fluid accumulation. Extensive colonic diverticular changes within the transverse colon and descending colon/ sigmoid colon. Unremarkable stomach. Unremarkable small bowel. No transition point. No abnormally distended small bowel or colon. Normal appendix. Vascular/Lymphatic: Tortuosity of the vasculature. Minimal calcifications of the aorta. No aneurysm. Proximal femoral vasculature patent. Unremarkable appearance of the portal system. Reproductive: Calcifications of the prostate. Transverse diameter of prostate measures 4.6 cm. Other: No abdominal wall hernia. Musculoskeletal: No displaced fracture. Mild degenerative changes of the hips. IMPRESSION: Short-segment sigmoid colon wall thickening/inflammation with associated changes of the abdominal fat compatible with diverticulitis. No complicating features. Referral for gastroenterology  follow-up should be considered, as occult tumor cannot be excluded. These results were called by telephone at the time of interpretation on 02/19/2017 at 2:42 pm to Dr. Wynetta Emery , who verbally acknowledged these results. Electronically Signed   By: Gilmer Mor D.O.   On: 02/19/2017 14:43    Procedures Procedures (including critical care time)  Medications Ordered in ED Medications  morphine 4 MG/ML injection 4 mg (4 mg Intravenous Given 02/19/17 1246)  ondansetron (ZOFRAN) injection 4 mg (4 mg Intravenous Given 02/19/17 1246)  iopamidol (ISOVUE-300) 61 % injection (100 mLs  Contrast Given 02/19/17 1359)  ciprofloxacin (CIPRO) tablet 500 mg (500 mg Oral Given 02/19/17 1457)  metroNIDAZOLE (FLAGYL) tablet 500 mg (500 mg Oral Given 02/19/17 1456)  Initial Impression / Assessment and Plan / ED Course  I have reviewed the triage vital signs and the nursing notes.  Pertinent labs & imaging results that were available during my care of the patient were reviewed by me and considered in my medical decision making (see chart for details).     Vitals:   02/19/17 1147 02/19/17 1230 02/19/17 1430  BP: 114/74 119/80 115/64  Pulse: (!) 58 (!) 57 70  Resp: (!) 25    Temp: 98.2 F (36.8 C)    SpO2: 100% 100% 95%  Weight: 68 kg (150 lb)    Height: 5' 5.5" (1.664 m)      Medications  morphine 4 MG/ML injection 4 mg (4 mg Intravenous Given 02/19/17 1246)  ondansetron (ZOFRAN) injection 4 mg (4 mg Intravenous Given 02/19/17 1246)  iopamidol (ISOVUE-300) 61 % injection (100 mLs  Contrast Given 02/19/17 1359)  ciprofloxacin (CIPRO) tablet 500 mg (500 mg Oral Given 02/19/17 1457)  metroNIDAZOLE (FLAGYL) tablet 500 mg (500 mg Oral Given 02/19/17 1456)    Tyler Hickman is 56 y.o. male presenting with Severe lower abdominal pain onset this morning. Patient afebrile and nontoxic appearing. Mild leukocytosis blood work and urinalysis otherwise reassuring. Radiology has called to give verbal report:  Likely early diverticulitis however mass or tumor cannot be excluded. I've explained this to the patient. He had a colonoscopy by Dr. Matthias Hughs. Advised him to follow closely for further evaluation.  Evaluation does not show pathology that would require ongoing emergent intervention or inpatient treatment. Pt is hemodynamically stable and mentating appropriately. Discussed findings and plan with patient/guardian, who agrees with care plan. All questions answered. Return precautions discussed and outpatient follow up given.      Final Clinical Impressions(s) / ED Diagnoses   Final diagnoses:  Diverticulitis    New Prescriptions Discharge Medication List as of 02/19/2017  3:04 PM    START taking these medications   Details  ciprofloxacin (CIPRO) 500 MG tablet Take 1 tablet (500 mg total) by mouth every 12 (twelve) hours., Starting Sun 02/19/2017, Print    metroNIDAZOLE (FLAGYL) 500 MG tablet Take 1 tablet (500 mg total) by mouth 3 (three) times daily. One tab PO bid x 10 days, Starting Sun 02/19/2017, Until Wed 03/01/2017, Print    oxyCODONE-acetaminophen (PERCOCET) 5-325 MG tablet Take 1 tablet by mouth every 4 (four) hours as needed., Starting Sun 02/19/2017, Print         Claris Guymon, [place], PA-C 02/19/17 1512    Rolan Bucco, MD 02/19/17 1526

## 2017-02-19 NOTE — ED Triage Notes (Signed)
Pt. Stated, I started having stomach pains this morning. I ve had some tingling in my hands. Pt. Breathing (hyperventilating). Pt. Stated, I think I have an ulcer.

## 2017-02-19 NOTE — ED Notes (Signed)
Patient taken to CT.

## 2017-02-19 NOTE — Discharge Instructions (Signed)
You must maintain a full liquid diet for 3 days. ° °Take percocet for breakthrough pain, do not drink alcohol, drive, care for children or do other critical tasks while taking percocet. ° °Take your antibiotics as directed and to completion. You should never have any leftover antibiotics! Push fluids and stay well hydrated.  °Do not drink alcohol while you are taking flagyl (metronidazole) because it will make you very sick. ° °Please take probiotics that you can get over-the-counter, asked your pharmacist for recommendations if needed. ° °Please follow with your primary care doctor in the next 2 days for a check-up. They must obtain records for further management.  ° °Do not hesitate to return to the Emergency Department for any new, worsening or concerning symptoms.  ° ° ° °

## 2017-02-19 NOTE — ED Notes (Signed)
Patient Alert and oriented X4. Stable and ambulatory. Patient verbalized understanding of the discharge instructions.  Patient belongings were taken by the patient.  

## 2017-02-20 DIAGNOSIS — B079 Viral wart, unspecified: Secondary | ICD-10-CM | POA: Diagnosis not present

## 2017-02-20 DIAGNOSIS — K5792 Diverticulitis of intestine, part unspecified, without perforation or abscess without bleeding: Secondary | ICD-10-CM | POA: Diagnosis not present

## 2017-02-22 DIAGNOSIS — K5792 Diverticulitis of intestine, part unspecified, without perforation or abscess without bleeding: Secondary | ICD-10-CM | POA: Diagnosis not present

## 2017-02-28 DIAGNOSIS — K5792 Diverticulitis of intestine, part unspecified, without perforation or abscess without bleeding: Secondary | ICD-10-CM | POA: Diagnosis not present

## 2017-03-06 DIAGNOSIS — F330 Major depressive disorder, recurrent, mild: Secondary | ICD-10-CM | POA: Diagnosis not present

## 2017-03-20 DIAGNOSIS — F330 Major depressive disorder, recurrent, mild: Secondary | ICD-10-CM | POA: Diagnosis not present

## 2017-04-03 DIAGNOSIS — F330 Major depressive disorder, recurrent, mild: Secondary | ICD-10-CM | POA: Diagnosis not present

## 2017-04-20 DIAGNOSIS — F330 Major depressive disorder, recurrent, mild: Secondary | ICD-10-CM | POA: Diagnosis not present

## 2017-05-01 DIAGNOSIS — F330 Major depressive disorder, recurrent, mild: Secondary | ICD-10-CM | POA: Diagnosis not present

## 2017-05-02 DIAGNOSIS — H5213 Myopia, bilateral: Secondary | ICD-10-CM | POA: Diagnosis not present

## 2017-05-15 DIAGNOSIS — F330 Major depressive disorder, recurrent, mild: Secondary | ICD-10-CM | POA: Diagnosis not present

## 2017-05-20 DIAGNOSIS — S61211A Laceration without foreign body of left index finger without damage to nail, initial encounter: Secondary | ICD-10-CM | POA: Diagnosis not present

## 2017-06-02 DIAGNOSIS — F330 Major depressive disorder, recurrent, mild: Secondary | ICD-10-CM | POA: Diagnosis not present

## 2017-06-02 DIAGNOSIS — B079 Viral wart, unspecified: Secondary | ICD-10-CM | POA: Diagnosis not present

## 2017-06-13 DIAGNOSIS — B079 Viral wart, unspecified: Secondary | ICD-10-CM | POA: Diagnosis not present

## 2017-06-16 DIAGNOSIS — F330 Major depressive disorder, recurrent, mild: Secondary | ICD-10-CM | POA: Diagnosis not present

## 2017-07-03 DIAGNOSIS — F330 Major depressive disorder, recurrent, mild: Secondary | ICD-10-CM | POA: Diagnosis not present

## 2017-07-07 DIAGNOSIS — B079 Viral wart, unspecified: Secondary | ICD-10-CM | POA: Diagnosis not present

## 2017-07-07 DIAGNOSIS — Z23 Encounter for immunization: Secondary | ICD-10-CM | POA: Diagnosis not present

## 2017-07-07 DIAGNOSIS — E7800 Pure hypercholesterolemia, unspecified: Secondary | ICD-10-CM | POA: Diagnosis not present

## 2017-07-07 DIAGNOSIS — G4700 Insomnia, unspecified: Secondary | ICD-10-CM | POA: Diagnosis not present

## 2017-07-07 DIAGNOSIS — F329 Major depressive disorder, single episode, unspecified: Secondary | ICD-10-CM | POA: Diagnosis not present

## 2017-07-13 DIAGNOSIS — F330 Major depressive disorder, recurrent, mild: Secondary | ICD-10-CM | POA: Diagnosis not present

## 2017-07-25 DIAGNOSIS — H5213 Myopia, bilateral: Secondary | ICD-10-CM | POA: Diagnosis not present

## 2017-07-25 DIAGNOSIS — F330 Major depressive disorder, recurrent, mild: Secondary | ICD-10-CM | POA: Diagnosis not present

## 2017-08-16 DIAGNOSIS — F330 Major depressive disorder, recurrent, mild: Secondary | ICD-10-CM | POA: Diagnosis not present

## 2017-08-24 DIAGNOSIS — J309 Allergic rhinitis, unspecified: Secondary | ICD-10-CM | POA: Diagnosis not present

## 2017-08-24 DIAGNOSIS — H1010 Acute atopic conjunctivitis, unspecified eye: Secondary | ICD-10-CM | POA: Diagnosis not present

## 2017-08-24 DIAGNOSIS — L2084 Intrinsic (allergic) eczema: Secondary | ICD-10-CM | POA: Diagnosis not present

## 2017-08-24 DIAGNOSIS — T7800XD Anaphylactic reaction due to unspecified food, subsequent encounter: Secondary | ICD-10-CM | POA: Diagnosis not present

## 2017-08-31 DIAGNOSIS — F330 Major depressive disorder, recurrent, mild: Secondary | ICD-10-CM | POA: Diagnosis not present

## 2017-09-13 DIAGNOSIS — F330 Major depressive disorder, recurrent, mild: Secondary | ICD-10-CM | POA: Diagnosis not present

## 2017-09-26 DIAGNOSIS — F330 Major depressive disorder, recurrent, mild: Secondary | ICD-10-CM | POA: Diagnosis not present

## 2017-10-09 DIAGNOSIS — F330 Major depressive disorder, recurrent, mild: Secondary | ICD-10-CM | POA: Diagnosis not present

## 2017-10-14 DIAGNOSIS — L03011 Cellulitis of right finger: Secondary | ICD-10-CM | POA: Diagnosis not present

## 2017-10-23 DIAGNOSIS — F330 Major depressive disorder, recurrent, mild: Secondary | ICD-10-CM | POA: Diagnosis not present

## 2017-11-06 DIAGNOSIS — F330 Major depressive disorder, recurrent, mild: Secondary | ICD-10-CM | POA: Diagnosis not present

## 2017-11-20 DIAGNOSIS — F330 Major depressive disorder, recurrent, mild: Secondary | ICD-10-CM | POA: Diagnosis not present

## 2017-11-27 DIAGNOSIS — F330 Major depressive disorder, recurrent, mild: Secondary | ICD-10-CM | POA: Diagnosis not present

## 2017-12-11 DIAGNOSIS — F330 Major depressive disorder, recurrent, mild: Secondary | ICD-10-CM | POA: Diagnosis not present

## 2017-12-25 DIAGNOSIS — F330 Major depressive disorder, recurrent, mild: Secondary | ICD-10-CM | POA: Diagnosis not present

## 2018-01-08 DIAGNOSIS — F330 Major depressive disorder, recurrent, mild: Secondary | ICD-10-CM | POA: Diagnosis not present

## 2018-01-09 DIAGNOSIS — G4700 Insomnia, unspecified: Secondary | ICD-10-CM | POA: Diagnosis not present

## 2018-01-09 DIAGNOSIS — N401 Enlarged prostate with lower urinary tract symptoms: Secondary | ICD-10-CM | POA: Diagnosis not present

## 2018-01-09 DIAGNOSIS — E7800 Pure hypercholesterolemia, unspecified: Secondary | ICD-10-CM | POA: Diagnosis not present

## 2018-01-09 DIAGNOSIS — Z0184 Encounter for antibody response examination: Secondary | ICD-10-CM | POA: Diagnosis not present

## 2018-01-09 DIAGNOSIS — F329 Major depressive disorder, single episode, unspecified: Secondary | ICD-10-CM | POA: Diagnosis not present

## 2018-01-09 DIAGNOSIS — B079 Viral wart, unspecified: Secondary | ICD-10-CM | POA: Diagnosis not present

## 2018-01-09 DIAGNOSIS — J309 Allergic rhinitis, unspecified: Secondary | ICD-10-CM | POA: Diagnosis not present

## 2018-01-09 DIAGNOSIS — Z0000 Encounter for general adult medical examination without abnormal findings: Secondary | ICD-10-CM | POA: Diagnosis not present

## 2018-01-16 DIAGNOSIS — Z23 Encounter for immunization: Secondary | ICD-10-CM | POA: Diagnosis not present

## 2018-01-22 DIAGNOSIS — F330 Major depressive disorder, recurrent, mild: Secondary | ICD-10-CM | POA: Diagnosis not present

## 2018-02-12 DIAGNOSIS — F330 Major depressive disorder, recurrent, mild: Secondary | ICD-10-CM | POA: Diagnosis not present

## 2018-02-27 DIAGNOSIS — F330 Major depressive disorder, recurrent, mild: Secondary | ICD-10-CM | POA: Diagnosis not present

## 2018-03-12 DIAGNOSIS — F330 Major depressive disorder, recurrent, mild: Secondary | ICD-10-CM | POA: Diagnosis not present

## 2018-03-26 DIAGNOSIS — F330 Major depressive disorder, recurrent, mild: Secondary | ICD-10-CM | POA: Diagnosis not present

## 2018-04-09 DIAGNOSIS — F330 Major depressive disorder, recurrent, mild: Secondary | ICD-10-CM | POA: Diagnosis not present

## 2018-04-11 DIAGNOSIS — H1010 Acute atopic conjunctivitis, unspecified eye: Secondary | ICD-10-CM | POA: Diagnosis not present

## 2018-04-11 DIAGNOSIS — J309 Allergic rhinitis, unspecified: Secondary | ICD-10-CM | POA: Diagnosis not present

## 2018-04-11 DIAGNOSIS — T7800XD Anaphylactic reaction due to unspecified food, subsequent encounter: Secondary | ICD-10-CM | POA: Diagnosis not present

## 2018-04-11 DIAGNOSIS — L2084 Intrinsic (allergic) eczema: Secondary | ICD-10-CM | POA: Diagnosis not present

## 2018-04-23 DIAGNOSIS — F330 Major depressive disorder, recurrent, mild: Secondary | ICD-10-CM | POA: Diagnosis not present

## 2018-05-03 DIAGNOSIS — H5213 Myopia, bilateral: Secondary | ICD-10-CM | POA: Diagnosis not present

## 2018-05-03 DIAGNOSIS — H524 Presbyopia: Secondary | ICD-10-CM | POA: Diagnosis not present

## 2018-05-08 DIAGNOSIS — F330 Major depressive disorder, recurrent, mild: Secondary | ICD-10-CM | POA: Diagnosis not present

## 2018-05-21 DIAGNOSIS — F330 Major depressive disorder, recurrent, mild: Secondary | ICD-10-CM | POA: Diagnosis not present

## 2018-06-04 DIAGNOSIS — H5213 Myopia, bilateral: Secondary | ICD-10-CM | POA: Diagnosis not present

## 2018-06-04 DIAGNOSIS — F330 Major depressive disorder, recurrent, mild: Secondary | ICD-10-CM | POA: Diagnosis not present

## 2018-06-17 DIAGNOSIS — R1032 Left lower quadrant pain: Secondary | ICD-10-CM | POA: Diagnosis not present

## 2018-06-18 DIAGNOSIS — F330 Major depressive disorder, recurrent, mild: Secondary | ICD-10-CM | POA: Diagnosis not present

## 2018-07-05 DIAGNOSIS — F330 Major depressive disorder, recurrent, mild: Secondary | ICD-10-CM | POA: Diagnosis not present

## 2018-07-16 DIAGNOSIS — F330 Major depressive disorder, recurrent, mild: Secondary | ICD-10-CM | POA: Diagnosis not present

## 2018-07-18 DIAGNOSIS — Z23 Encounter for immunization: Secondary | ICD-10-CM | POA: Diagnosis not present

## 2018-07-18 DIAGNOSIS — G4700 Insomnia, unspecified: Secondary | ICD-10-CM | POA: Diagnosis not present

## 2018-07-18 DIAGNOSIS — F329 Major depressive disorder, single episode, unspecified: Secondary | ICD-10-CM | POA: Diagnosis not present

## 2018-07-18 DIAGNOSIS — J309 Allergic rhinitis, unspecified: Secondary | ICD-10-CM | POA: Diagnosis not present

## 2018-07-18 DIAGNOSIS — E7800 Pure hypercholesterolemia, unspecified: Secondary | ICD-10-CM | POA: Diagnosis not present

## 2018-07-30 DIAGNOSIS — F330 Major depressive disorder, recurrent, mild: Secondary | ICD-10-CM | POA: Diagnosis not present

## 2018-08-13 DIAGNOSIS — F330 Major depressive disorder, recurrent, mild: Secondary | ICD-10-CM | POA: Diagnosis not present

## 2018-08-23 DIAGNOSIS — F330 Major depressive disorder, recurrent, mild: Secondary | ICD-10-CM | POA: Diagnosis not present

## 2018-09-10 DIAGNOSIS — F330 Major depressive disorder, recurrent, mild: Secondary | ICD-10-CM | POA: Diagnosis not present

## 2018-09-26 DIAGNOSIS — F330 Major depressive disorder, recurrent, mild: Secondary | ICD-10-CM | POA: Diagnosis not present

## 2018-10-22 DIAGNOSIS — F330 Major depressive disorder, recurrent, mild: Secondary | ICD-10-CM | POA: Diagnosis not present

## 2018-11-05 DIAGNOSIS — F330 Major depressive disorder, recurrent, mild: Secondary | ICD-10-CM | POA: Diagnosis not present

## 2018-11-21 DIAGNOSIS — F330 Major depressive disorder, recurrent, mild: Secondary | ICD-10-CM | POA: Diagnosis not present

## 2018-12-03 DIAGNOSIS — F330 Major depressive disorder, recurrent, mild: Secondary | ICD-10-CM | POA: Diagnosis not present

## 2018-12-17 DIAGNOSIS — F330 Major depressive disorder, recurrent, mild: Secondary | ICD-10-CM | POA: Diagnosis not present

## 2018-12-31 DIAGNOSIS — F330 Major depressive disorder, recurrent, mild: Secondary | ICD-10-CM | POA: Diagnosis not present

## 2019-01-14 DIAGNOSIS — F330 Major depressive disorder, recurrent, mild: Secondary | ICD-10-CM | POA: Diagnosis not present

## 2019-01-29 DIAGNOSIS — F411 Generalized anxiety disorder: Secondary | ICD-10-CM | POA: Diagnosis not present

## 2019-01-29 DIAGNOSIS — F330 Major depressive disorder, recurrent, mild: Secondary | ICD-10-CM | POA: Diagnosis not present

## 2019-02-10 IMAGING — CT CT ABD-PELV W/ CM
2 of 5 series · 16 of 46 positions shown, 18 images · IV contrast (Omni 300)
Comparison: None.

CLINICAL DATA: 55-year-old male with a history of lower abdominal
pain

EXAM:
CT ABDOMEN AND PELVIS WITH CONTRAST
TECHNIQUE: Multidetector CT imaging of the abdomen and pelvis was performed
using the standard protocol following bolus administration of
intravenous contrast.
CONTRAST:  100mL FEGYZA-XUU IOPAMIDOL (FEGYZA-XUU) INJECTION 61%

[Series 3: a/p w/ 5mm · axial · 0.71mm/px · z∈[+749,+1124]mm · 13 of 85 slices shown, 15 images]
[im 5/85  soft-tissue]
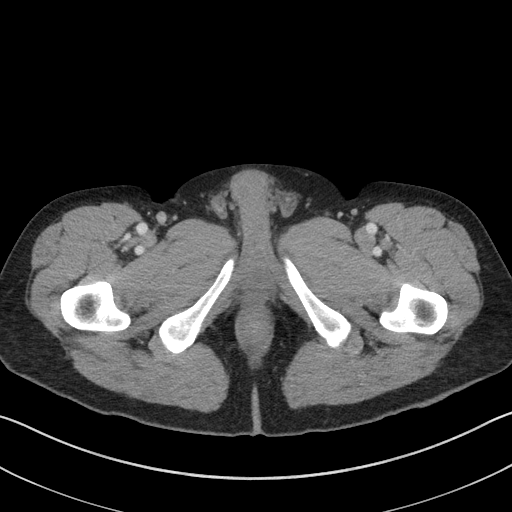
[im 5/85  bone]
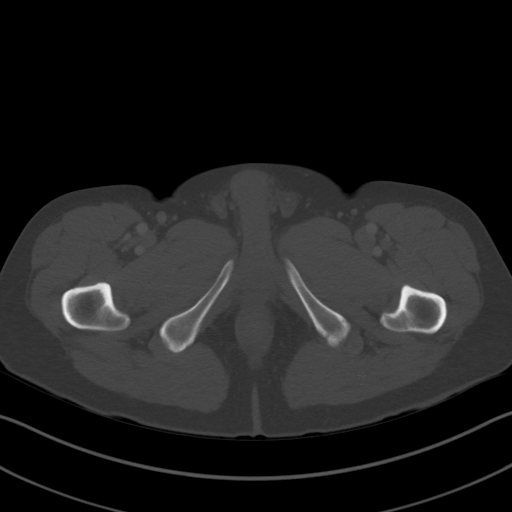
[im 13/85  soft-tissue]
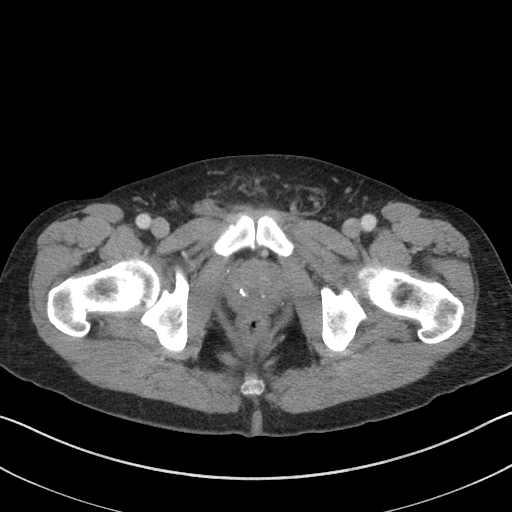
[im 17/85  soft-tissue]
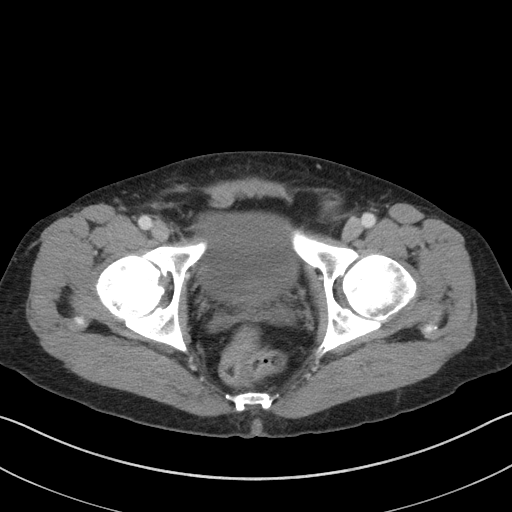
[im 26/85  soft-tissue]
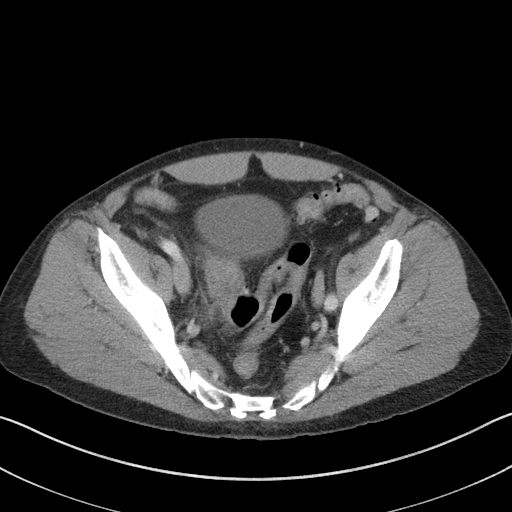
[im 30/85  soft-tissue]
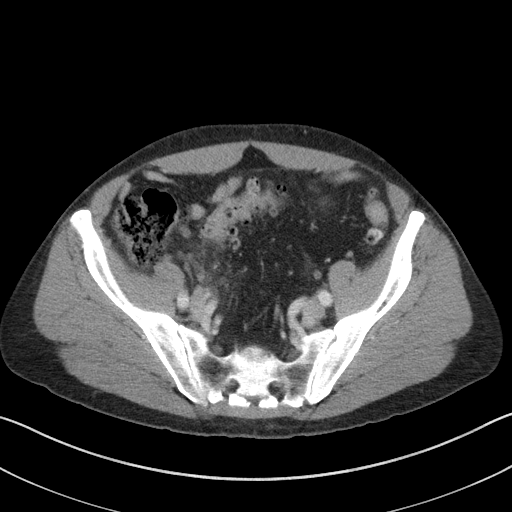
[im 38/85  soft-tissue]
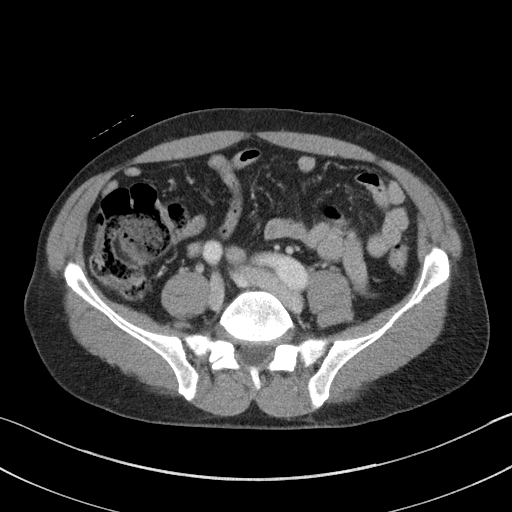
[im 43/85  soft-tissue]
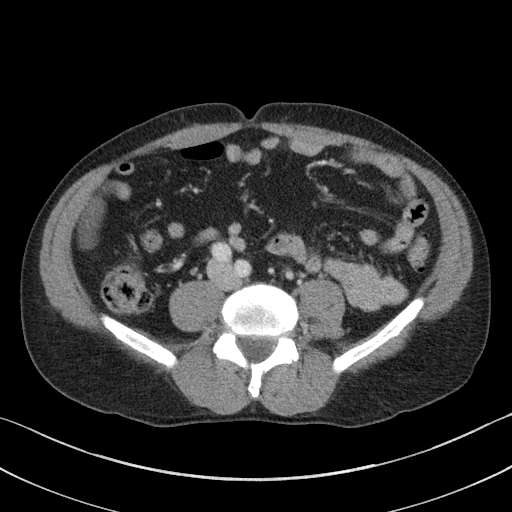
[im 47/85  soft-tissue]
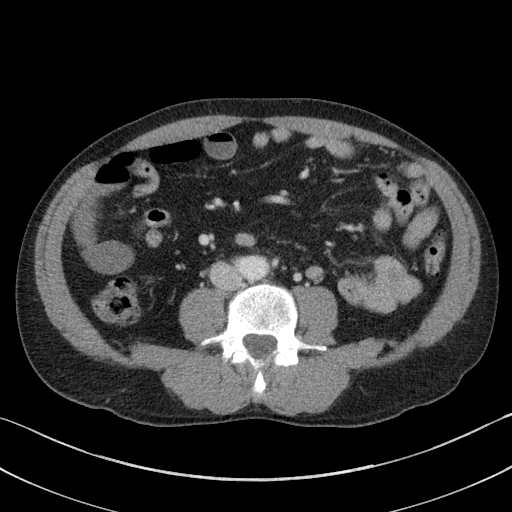
[im 55/85  soft-tissue]
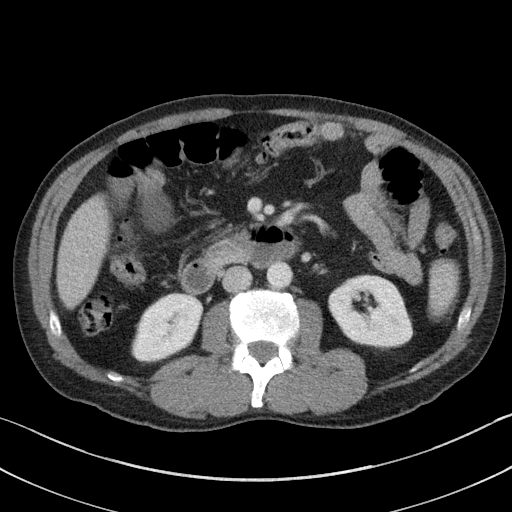
[im 55/85  bone]
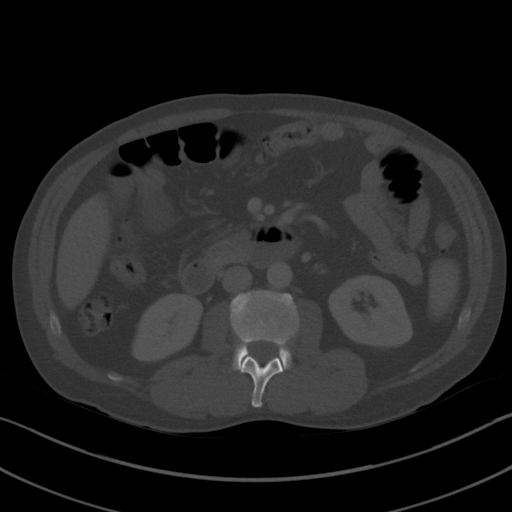
[im 59/85  soft-tissue]
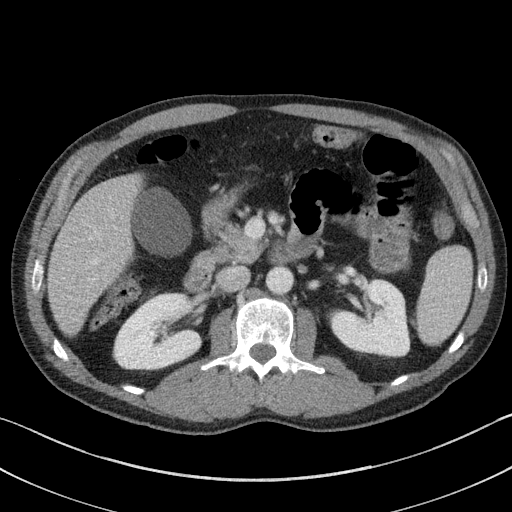
[im 68/85  soft-tissue]
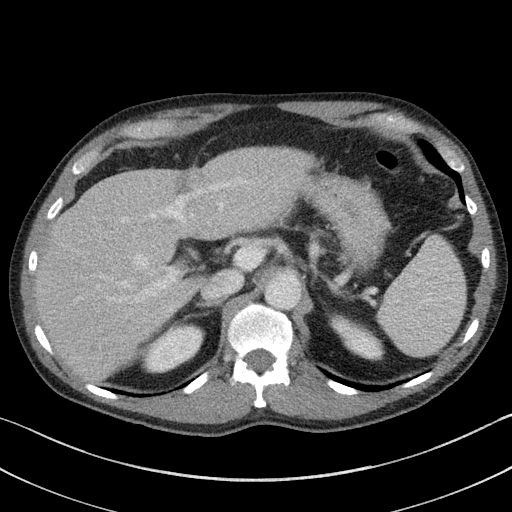
[im 72/85  soft-tissue]
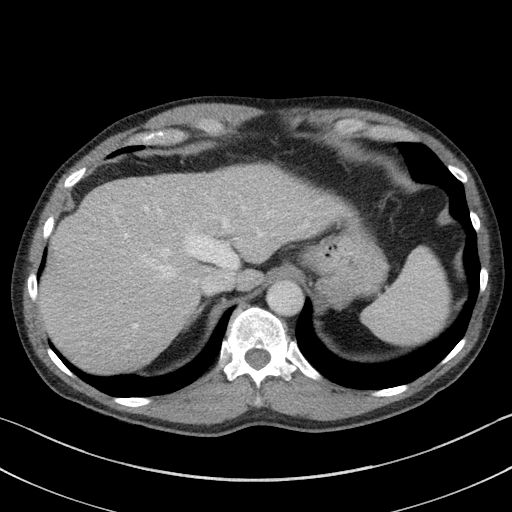
[im 80/85  soft-tissue]
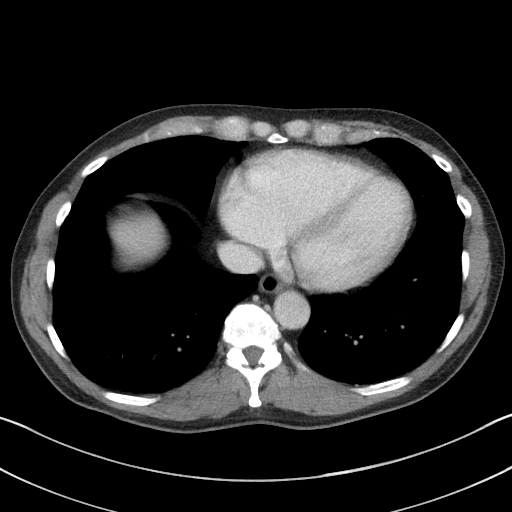

[Series 6: a/p w/ cor · coronal · 0.74mm/px · 3 of 149 slices shown]
[im 50/149  soft-tissue]
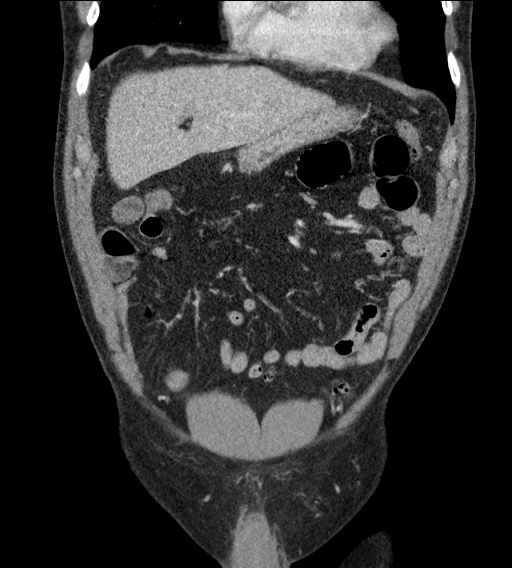
[im 66/149  soft-tissue]
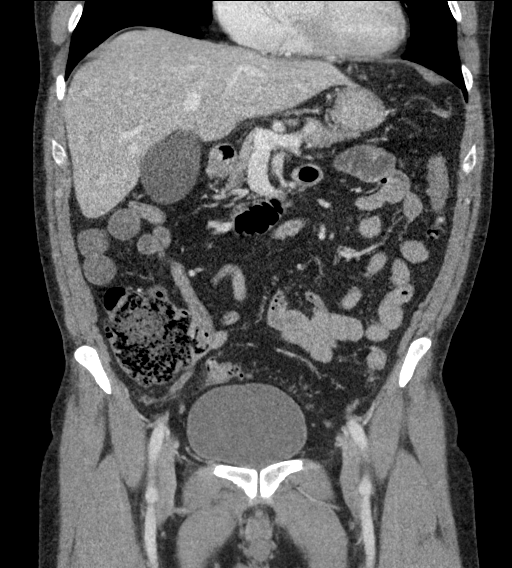
[im 83/149  soft-tissue]
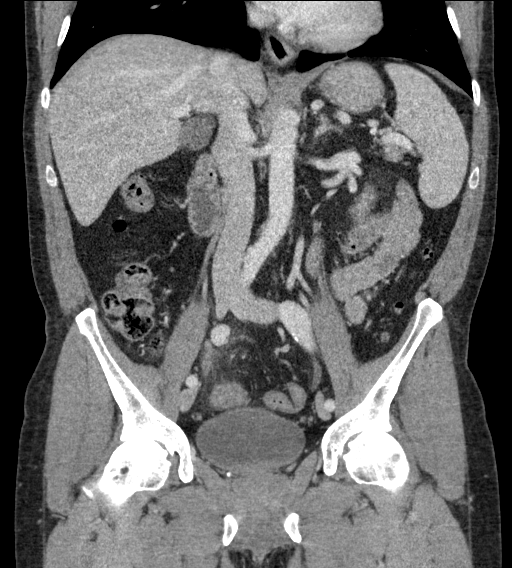

[16 of 46 positions shown; findings below may reference images not displayed]

FINDINGS: Lower chest: No acute abnormality.

Hepatobiliary: No focal liver abnormality is seen. No gallstones,
gallbladder wall thickening, or biliary dilatation.

Pancreas: Unremarkable. No pancreatic ductal dilatation or
surrounding inflammatory changes.

Spleen: Normal in size without focal abnormality.

Adrenals/Urinary Tract: Adrenal glands are unremarkable. Kidneys are
normal, without renal calculi, focal lesion, or hydronephrosis.
Bladder is unremarkable.

Stomach/Bowel: Short segment of circumferential thickening of the
sigmoid colon with inflammatory changes of the adjacent fat and mild
peritoneal thickening/ peritoneal fluid accumulation. Extensive
colonic diverticular changes within the transverse colon and
descending colon/ sigmoid colon.

Unremarkable stomach. Unremarkable small bowel. No transition point.
No abnormally distended small bowel or colon. Normal appendix.

Vascular/Lymphatic: Tortuosity of the vasculature. Minimal
calcifications of the aorta. No aneurysm. Proximal femoral
vasculature patent. Unremarkable appearance of the portal system.

Reproductive: Calcifications of the prostate. Transverse diameter of
prostate measures 4.6 cm.

Other: No abdominal wall hernia.

Musculoskeletal: No displaced fracture. Mild degenerative changes of
the hips.
IMPRESSION: Short-segment sigmoid colon wall thickening/inflammation with
associated changes of the abdominal fat compatible with
diverticulitis. No complicating features. Referral for
gastroenterology follow-up should be considered, as occult tumor
cannot be excluded.

These results were called by telephone at the time of interpretation
on 02/19/2017 at [DATE] to Dr. ALP REINERT , who verbally
acknowledged these results.

## 2019-02-11 DIAGNOSIS — F330 Major depressive disorder, recurrent, mild: Secondary | ICD-10-CM | POA: Diagnosis not present

## 2019-02-11 DIAGNOSIS — F411 Generalized anxiety disorder: Secondary | ICD-10-CM | POA: Diagnosis not present

## 2019-02-25 DIAGNOSIS — F330 Major depressive disorder, recurrent, mild: Secondary | ICD-10-CM | POA: Diagnosis not present

## 2019-02-25 DIAGNOSIS — F411 Generalized anxiety disorder: Secondary | ICD-10-CM | POA: Diagnosis not present

## 2019-03-11 DIAGNOSIS — F411 Generalized anxiety disorder: Secondary | ICD-10-CM | POA: Diagnosis not present

## 2019-03-11 DIAGNOSIS — F330 Major depressive disorder, recurrent, mild: Secondary | ICD-10-CM | POA: Diagnosis not present

## 2019-03-25 DIAGNOSIS — F330 Major depressive disorder, recurrent, mild: Secondary | ICD-10-CM | POA: Diagnosis not present

## 2019-04-08 DIAGNOSIS — F330 Major depressive disorder, recurrent, mild: Secondary | ICD-10-CM | POA: Diagnosis not present

## 2019-04-08 DIAGNOSIS — F411 Generalized anxiety disorder: Secondary | ICD-10-CM | POA: Diagnosis not present

## 2019-04-22 DIAGNOSIS — F411 Generalized anxiety disorder: Secondary | ICD-10-CM | POA: Diagnosis not present

## 2019-04-22 DIAGNOSIS — F330 Major depressive disorder, recurrent, mild: Secondary | ICD-10-CM | POA: Diagnosis not present

## 2019-05-06 DIAGNOSIS — H524 Presbyopia: Secondary | ICD-10-CM | POA: Diagnosis not present

## 2019-05-06 DIAGNOSIS — F411 Generalized anxiety disorder: Secondary | ICD-10-CM | POA: Diagnosis not present

## 2019-05-06 DIAGNOSIS — H5213 Myopia, bilateral: Secondary | ICD-10-CM | POA: Diagnosis not present

## 2019-05-06 DIAGNOSIS — F330 Major depressive disorder, recurrent, mild: Secondary | ICD-10-CM | POA: Diagnosis not present

## 2019-05-20 DIAGNOSIS — F330 Major depressive disorder, recurrent, mild: Secondary | ICD-10-CM | POA: Diagnosis not present

## 2019-05-20 DIAGNOSIS — F411 Generalized anxiety disorder: Secondary | ICD-10-CM | POA: Diagnosis not present

## 2019-05-22 DIAGNOSIS — S61215A Laceration without foreign body of left ring finger without damage to nail, initial encounter: Secondary | ICD-10-CM | POA: Diagnosis not present

## 2019-05-23 DIAGNOSIS — Z125 Encounter for screening for malignant neoplasm of prostate: Secondary | ICD-10-CM | POA: Diagnosis not present

## 2019-05-23 DIAGNOSIS — Z23 Encounter for immunization: Secondary | ICD-10-CM | POA: Diagnosis not present

## 2019-05-23 DIAGNOSIS — Z1211 Encounter for screening for malignant neoplasm of colon: Secondary | ICD-10-CM | POA: Diagnosis not present

## 2019-05-23 DIAGNOSIS — Z0000 Encounter for general adult medical examination without abnormal findings: Secondary | ICD-10-CM | POA: Diagnosis not present

## 2019-05-23 DIAGNOSIS — J309 Allergic rhinitis, unspecified: Secondary | ICD-10-CM | POA: Diagnosis not present

## 2019-05-23 DIAGNOSIS — E7800 Pure hypercholesterolemia, unspecified: Secondary | ICD-10-CM | POA: Diagnosis not present

## 2019-06-04 DIAGNOSIS — F411 Generalized anxiety disorder: Secondary | ICD-10-CM | POA: Diagnosis not present

## 2019-06-04 DIAGNOSIS — F330 Major depressive disorder, recurrent, mild: Secondary | ICD-10-CM | POA: Diagnosis not present

## 2019-06-17 DIAGNOSIS — F330 Major depressive disorder, recurrent, mild: Secondary | ICD-10-CM | POA: Diagnosis not present

## 2019-06-17 DIAGNOSIS — F411 Generalized anxiety disorder: Secondary | ICD-10-CM | POA: Diagnosis not present

## 2019-07-01 DIAGNOSIS — F330 Major depressive disorder, recurrent, mild: Secondary | ICD-10-CM | POA: Diagnosis not present

## 2019-07-01 DIAGNOSIS — F411 Generalized anxiety disorder: Secondary | ICD-10-CM | POA: Diagnosis not present

## 2019-07-15 DIAGNOSIS — F330 Major depressive disorder, recurrent, mild: Secondary | ICD-10-CM | POA: Diagnosis not present

## 2019-07-15 DIAGNOSIS — F411 Generalized anxiety disorder: Secondary | ICD-10-CM | POA: Diagnosis not present

## 2019-07-19 DIAGNOSIS — Z23 Encounter for immunization: Secondary | ICD-10-CM | POA: Diagnosis not present

## 2019-07-22 ENCOUNTER — Other Ambulatory Visit: Payer: Self-pay

## 2019-07-22 DIAGNOSIS — Z20822 Contact with and (suspected) exposure to covid-19: Secondary | ICD-10-CM

## 2019-07-24 LAB — NOVEL CORONAVIRUS, NAA: SARS-CoV-2, NAA: NOT DETECTED

## 2019-08-05 DIAGNOSIS — F330 Major depressive disorder, recurrent, mild: Secondary | ICD-10-CM | POA: Diagnosis not present

## 2019-08-05 DIAGNOSIS — F411 Generalized anxiety disorder: Secondary | ICD-10-CM | POA: Diagnosis not present

## 2019-08-14 DIAGNOSIS — Z20828 Contact with and (suspected) exposure to other viral communicable diseases: Secondary | ICD-10-CM | POA: Diagnosis not present

## 2019-08-15 DIAGNOSIS — Z20828 Contact with and (suspected) exposure to other viral communicable diseases: Secondary | ICD-10-CM | POA: Diagnosis not present

## 2019-09-01 DIAGNOSIS — Z20828 Contact with and (suspected) exposure to other viral communicable diseases: Secondary | ICD-10-CM | POA: Diagnosis not present

## 2019-09-02 DIAGNOSIS — F330 Major depressive disorder, recurrent, mild: Secondary | ICD-10-CM | POA: Diagnosis not present

## 2019-09-02 DIAGNOSIS — F411 Generalized anxiety disorder: Secondary | ICD-10-CM | POA: Diagnosis not present

## 2019-09-16 DIAGNOSIS — F330 Major depressive disorder, recurrent, mild: Secondary | ICD-10-CM | POA: Diagnosis not present

## 2019-09-16 DIAGNOSIS — F411 Generalized anxiety disorder: Secondary | ICD-10-CM | POA: Diagnosis not present

## 2019-09-21 DIAGNOSIS — S0502XA Injury of conjunctiva and corneal abrasion without foreign body, left eye, initial encounter: Secondary | ICD-10-CM | POA: Diagnosis not present

## 2019-10-03 DIAGNOSIS — F330 Major depressive disorder, recurrent, mild: Secondary | ICD-10-CM | POA: Diagnosis not present

## 2019-10-03 DIAGNOSIS — F411 Generalized anxiety disorder: Secondary | ICD-10-CM | POA: Diagnosis not present

## 2019-10-16 DIAGNOSIS — Z20822 Contact with and (suspected) exposure to covid-19: Secondary | ICD-10-CM | POA: Diagnosis not present

## 2019-11-01 DIAGNOSIS — F330 Major depressive disorder, recurrent, mild: Secondary | ICD-10-CM | POA: Diagnosis not present

## 2019-11-01 DIAGNOSIS — F411 Generalized anxiety disorder: Secondary | ICD-10-CM | POA: Diagnosis not present

## 2019-11-19 DIAGNOSIS — F329 Major depressive disorder, single episode, unspecified: Secondary | ICD-10-CM | POA: Diagnosis not present

## 2019-11-19 DIAGNOSIS — R001 Bradycardia, unspecified: Secondary | ICD-10-CM | POA: Diagnosis not present

## 2019-11-19 DIAGNOSIS — E7800 Pure hypercholesterolemia, unspecified: Secondary | ICD-10-CM | POA: Diagnosis not present

## 2019-11-19 DIAGNOSIS — G43009 Migraine without aura, not intractable, without status migrainosus: Secondary | ICD-10-CM | POA: Diagnosis not present

## 2019-11-19 DIAGNOSIS — L659 Nonscarring hair loss, unspecified: Secondary | ICD-10-CM | POA: Diagnosis not present

## 2019-11-27 DIAGNOSIS — F330 Major depressive disorder, recurrent, mild: Secondary | ICD-10-CM | POA: Diagnosis not present

## 2019-11-27 DIAGNOSIS — F411 Generalized anxiety disorder: Secondary | ICD-10-CM | POA: Diagnosis not present

## 2019-12-17 DIAGNOSIS — F330 Major depressive disorder, recurrent, mild: Secondary | ICD-10-CM | POA: Diagnosis not present

## 2019-12-17 DIAGNOSIS — F411 Generalized anxiety disorder: Secondary | ICD-10-CM | POA: Diagnosis not present

## 2020-01-01 DIAGNOSIS — F330 Major depressive disorder, recurrent, mild: Secondary | ICD-10-CM | POA: Diagnosis not present

## 2020-01-01 DIAGNOSIS — F411 Generalized anxiety disorder: Secondary | ICD-10-CM | POA: Diagnosis not present

## 2020-01-14 DIAGNOSIS — F411 Generalized anxiety disorder: Secondary | ICD-10-CM | POA: Diagnosis not present

## 2020-01-14 DIAGNOSIS — F330 Major depressive disorder, recurrent, mild: Secondary | ICD-10-CM | POA: Diagnosis not present

## 2020-01-20 DIAGNOSIS — H5213 Myopia, bilateral: Secondary | ICD-10-CM | POA: Diagnosis not present

## 2020-01-28 DIAGNOSIS — F411 Generalized anxiety disorder: Secondary | ICD-10-CM | POA: Diagnosis not present

## 2020-01-28 DIAGNOSIS — F330 Major depressive disorder, recurrent, mild: Secondary | ICD-10-CM | POA: Diagnosis not present

## 2020-03-04 DIAGNOSIS — F411 Generalized anxiety disorder: Secondary | ICD-10-CM | POA: Diagnosis not present

## 2020-03-04 DIAGNOSIS — F330 Major depressive disorder, recurrent, mild: Secondary | ICD-10-CM | POA: Diagnosis not present

## 2020-03-26 DIAGNOSIS — F330 Major depressive disorder, recurrent, mild: Secondary | ICD-10-CM | POA: Diagnosis not present

## 2020-03-26 DIAGNOSIS — F411 Generalized anxiety disorder: Secondary | ICD-10-CM | POA: Diagnosis not present

## 2020-04-04 DIAGNOSIS — Z20822 Contact with and (suspected) exposure to covid-19: Secondary | ICD-10-CM | POA: Diagnosis not present

## 2020-04-06 DIAGNOSIS — M25552 Pain in left hip: Secondary | ICD-10-CM | POA: Diagnosis not present

## 2020-04-06 DIAGNOSIS — M1612 Unilateral primary osteoarthritis, left hip: Secondary | ICD-10-CM | POA: Diagnosis not present

## 2020-04-29 DIAGNOSIS — F330 Major depressive disorder, recurrent, mild: Secondary | ICD-10-CM | POA: Diagnosis not present

## 2020-04-29 DIAGNOSIS — F411 Generalized anxiety disorder: Secondary | ICD-10-CM | POA: Diagnosis not present

## 2020-05-05 DIAGNOSIS — H5213 Myopia, bilateral: Secondary | ICD-10-CM | POA: Diagnosis not present

## 2020-05-05 DIAGNOSIS — H2513 Age-related nuclear cataract, bilateral: Secondary | ICD-10-CM | POA: Diagnosis not present

## 2020-05-05 DIAGNOSIS — H16223 Keratoconjunctivitis sicca, not specified as Sjogren's, bilateral: Secondary | ICD-10-CM | POA: Diagnosis not present

## 2020-05-20 DIAGNOSIS — F330 Major depressive disorder, recurrent, mild: Secondary | ICD-10-CM | POA: Diagnosis not present

## 2020-05-20 DIAGNOSIS — F411 Generalized anxiety disorder: Secondary | ICD-10-CM | POA: Diagnosis not present

## 2020-05-25 DIAGNOSIS — Z23 Encounter for immunization: Secondary | ICD-10-CM | POA: Diagnosis not present

## 2020-05-25 DIAGNOSIS — E7800 Pure hypercholesterolemia, unspecified: Secondary | ICD-10-CM | POA: Diagnosis not present

## 2020-05-25 DIAGNOSIS — Z01818 Encounter for other preprocedural examination: Secondary | ICD-10-CM | POA: Diagnosis not present

## 2020-05-25 DIAGNOSIS — Z0000 Encounter for general adult medical examination without abnormal findings: Secondary | ICD-10-CM | POA: Diagnosis not present

## 2020-05-25 DIAGNOSIS — M79605 Pain in left leg: Secondary | ICD-10-CM | POA: Diagnosis not present

## 2020-05-25 DIAGNOSIS — F329 Major depressive disorder, single episode, unspecified: Secondary | ICD-10-CM | POA: Diagnosis not present

## 2020-05-25 DIAGNOSIS — Z125 Encounter for screening for malignant neoplasm of prostate: Secondary | ICD-10-CM | POA: Diagnosis not present

## 2020-06-12 DIAGNOSIS — M1612 Unilateral primary osteoarthritis, left hip: Secondary | ICD-10-CM | POA: Diagnosis not present

## 2020-06-15 DIAGNOSIS — M1612 Unilateral primary osteoarthritis, left hip: Secondary | ICD-10-CM | POA: Diagnosis not present

## 2020-06-23 DIAGNOSIS — F411 Generalized anxiety disorder: Secondary | ICD-10-CM | POA: Diagnosis not present

## 2020-06-23 DIAGNOSIS — F330 Major depressive disorder, recurrent, mild: Secondary | ICD-10-CM | POA: Diagnosis not present

## 2020-06-25 DIAGNOSIS — M1612 Unilateral primary osteoarthritis, left hip: Secondary | ICD-10-CM | POA: Diagnosis not present

## 2020-06-25 DIAGNOSIS — Z96642 Presence of left artificial hip joint: Secondary | ICD-10-CM | POA: Diagnosis not present

## 2020-06-30 ENCOUNTER — Encounter (HOSPITAL_COMMUNITY): Payer: BLUE CROSS/BLUE SHIELD

## 2020-07-23 DIAGNOSIS — F330 Major depressive disorder, recurrent, mild: Secondary | ICD-10-CM | POA: Diagnosis not present

## 2020-07-23 DIAGNOSIS — F411 Generalized anxiety disorder: Secondary | ICD-10-CM | POA: Diagnosis not present

## 2020-08-19 DIAGNOSIS — F330 Major depressive disorder, recurrent, mild: Secondary | ICD-10-CM | POA: Diagnosis not present

## 2020-08-19 DIAGNOSIS — F411 Generalized anxiety disorder: Secondary | ICD-10-CM | POA: Diagnosis not present

## 2020-08-31 DIAGNOSIS — S61011A Laceration without foreign body of right thumb without damage to nail, initial encounter: Secondary | ICD-10-CM | POA: Diagnosis not present

## 2020-09-11 DIAGNOSIS — Z01818 Encounter for other preprocedural examination: Secondary | ICD-10-CM | POA: Diagnosis not present

## 2020-09-23 DIAGNOSIS — F330 Major depressive disorder, recurrent, mild: Secondary | ICD-10-CM | POA: Diagnosis not present

## 2020-09-23 DIAGNOSIS — F411 Generalized anxiety disorder: Secondary | ICD-10-CM | POA: Diagnosis not present

## 2020-09-30 DIAGNOSIS — M25551 Pain in right hip: Secondary | ICD-10-CM | POA: Diagnosis not present

## 2020-09-30 DIAGNOSIS — Z01818 Encounter for other preprocedural examination: Secondary | ICD-10-CM | POA: Diagnosis not present

## 2020-10-14 DIAGNOSIS — M1611 Unilateral primary osteoarthritis, right hip: Secondary | ICD-10-CM | POA: Diagnosis not present

## 2020-10-19 DIAGNOSIS — F411 Generalized anxiety disorder: Secondary | ICD-10-CM | POA: Diagnosis not present

## 2020-10-19 DIAGNOSIS — F330 Major depressive disorder, recurrent, mild: Secondary | ICD-10-CM | POA: Diagnosis not present

## 2020-10-22 DIAGNOSIS — M1611 Unilateral primary osteoarthritis, right hip: Secondary | ICD-10-CM | POA: Diagnosis not present

## 2020-11-18 DIAGNOSIS — Z01812 Encounter for preprocedural laboratory examination: Secondary | ICD-10-CM | POA: Diagnosis not present

## 2020-11-23 DIAGNOSIS — K5730 Diverticulosis of large intestine without perforation or abscess without bleeding: Secondary | ICD-10-CM | POA: Diagnosis not present

## 2020-11-23 DIAGNOSIS — R001 Bradycardia, unspecified: Secondary | ICD-10-CM | POA: Diagnosis not present

## 2020-11-23 DIAGNOSIS — D120 Benign neoplasm of cecum: Secondary | ICD-10-CM | POA: Diagnosis not present

## 2020-11-23 DIAGNOSIS — N529 Male erectile dysfunction, unspecified: Secondary | ICD-10-CM | POA: Diagnosis not present

## 2020-11-23 DIAGNOSIS — K640 First degree hemorrhoids: Secondary | ICD-10-CM | POA: Diagnosis not present

## 2020-11-23 DIAGNOSIS — F329 Major depressive disorder, single episode, unspecified: Secondary | ICD-10-CM | POA: Diagnosis not present

## 2020-11-23 DIAGNOSIS — E7800 Pure hypercholesterolemia, unspecified: Secondary | ICD-10-CM | POA: Diagnosis not present

## 2020-11-23 DIAGNOSIS — Z1211 Encounter for screening for malignant neoplasm of colon: Secondary | ICD-10-CM | POA: Diagnosis not present

## 2020-11-26 DIAGNOSIS — F330 Major depressive disorder, recurrent, mild: Secondary | ICD-10-CM | POA: Diagnosis not present

## 2020-11-26 DIAGNOSIS — F411 Generalized anxiety disorder: Secondary | ICD-10-CM | POA: Diagnosis not present

## 2020-12-14 DIAGNOSIS — F411 Generalized anxiety disorder: Secondary | ICD-10-CM | POA: Diagnosis not present

## 2020-12-14 DIAGNOSIS — F330 Major depressive disorder, recurrent, mild: Secondary | ICD-10-CM | POA: Diagnosis not present

## 2021-01-18 DIAGNOSIS — F330 Major depressive disorder, recurrent, mild: Secondary | ICD-10-CM | POA: Diagnosis not present

## 2021-01-18 DIAGNOSIS — F411 Generalized anxiety disorder: Secondary | ICD-10-CM | POA: Diagnosis not present

## 2021-01-26 DIAGNOSIS — E871 Hypo-osmolality and hyponatremia: Secondary | ICD-10-CM | POA: Diagnosis not present

## 2021-01-29 DIAGNOSIS — Z96641 Presence of right artificial hip joint: Secondary | ICD-10-CM | POA: Diagnosis not present

## 2021-01-29 DIAGNOSIS — Z96642 Presence of left artificial hip joint: Secondary | ICD-10-CM | POA: Diagnosis not present

## 2021-02-15 DIAGNOSIS — F411 Generalized anxiety disorder: Secondary | ICD-10-CM | POA: Diagnosis not present

## 2021-02-15 DIAGNOSIS — F330 Major depressive disorder, recurrent, mild: Secondary | ICD-10-CM | POA: Diagnosis not present

## 2021-02-19 DIAGNOSIS — E871 Hypo-osmolality and hyponatremia: Secondary | ICD-10-CM | POA: Diagnosis not present

## 2021-02-19 DIAGNOSIS — R944 Abnormal results of kidney function studies: Secondary | ICD-10-CM | POA: Diagnosis not present

## 2021-02-23 ENCOUNTER — Other Ambulatory Visit: Payer: Self-pay | Admitting: Family Medicine

## 2021-02-23 DIAGNOSIS — E871 Hypo-osmolality and hyponatremia: Secondary | ICD-10-CM

## 2021-02-23 DIAGNOSIS — N289 Disorder of kidney and ureter, unspecified: Secondary | ICD-10-CM

## 2021-03-15 DIAGNOSIS — F411 Generalized anxiety disorder: Secondary | ICD-10-CM | POA: Diagnosis not present

## 2021-03-15 DIAGNOSIS — F330 Major depressive disorder, recurrent, mild: Secondary | ICD-10-CM | POA: Diagnosis not present

## 2021-04-12 DIAGNOSIS — F411 Generalized anxiety disorder: Secondary | ICD-10-CM | POA: Diagnosis not present

## 2021-04-12 DIAGNOSIS — F330 Major depressive disorder, recurrent, mild: Secondary | ICD-10-CM | POA: Diagnosis not present

## 2021-04-30 DIAGNOSIS — E871 Hypo-osmolality and hyponatremia: Secondary | ICD-10-CM | POA: Diagnosis not present

## 2021-04-30 DIAGNOSIS — E0789 Other specified disorders of thyroid: Secondary | ICD-10-CM | POA: Diagnosis not present

## 2021-05-06 DIAGNOSIS — H2513 Age-related nuclear cataract, bilateral: Secondary | ICD-10-CM | POA: Diagnosis not present

## 2021-05-06 DIAGNOSIS — H16223 Keratoconjunctivitis sicca, not specified as Sjogren's, bilateral: Secondary | ICD-10-CM | POA: Diagnosis not present

## 2021-05-06 DIAGNOSIS — H1045 Other chronic allergic conjunctivitis: Secondary | ICD-10-CM | POA: Diagnosis not present

## 2021-05-21 DIAGNOSIS — F330 Major depressive disorder, recurrent, mild: Secondary | ICD-10-CM | POA: Diagnosis not present

## 2021-05-21 DIAGNOSIS — F411 Generalized anxiety disorder: Secondary | ICD-10-CM | POA: Diagnosis not present

## 2021-06-07 DIAGNOSIS — U071 COVID-19: Secondary | ICD-10-CM | POA: Diagnosis not present

## 2021-06-07 DIAGNOSIS — R059 Cough, unspecified: Secondary | ICD-10-CM | POA: Diagnosis not present

## 2021-06-15 DIAGNOSIS — N529 Male erectile dysfunction, unspecified: Secondary | ICD-10-CM | POA: Diagnosis not present

## 2021-06-15 DIAGNOSIS — Z23 Encounter for immunization: Secondary | ICD-10-CM | POA: Diagnosis not present

## 2021-06-15 DIAGNOSIS — E7800 Pure hypercholesterolemia, unspecified: Secondary | ICD-10-CM | POA: Diagnosis not present

## 2021-06-15 DIAGNOSIS — J309 Allergic rhinitis, unspecified: Secondary | ICD-10-CM | POA: Diagnosis not present

## 2021-06-15 DIAGNOSIS — Z0000 Encounter for general adult medical examination without abnormal findings: Secondary | ICD-10-CM | POA: Diagnosis not present

## 2021-06-15 DIAGNOSIS — Z125 Encounter for screening for malignant neoplasm of prostate: Secondary | ICD-10-CM | POA: Diagnosis not present

## 2021-06-15 DIAGNOSIS — L659 Nonscarring hair loss, unspecified: Secondary | ICD-10-CM | POA: Diagnosis not present

## 2021-06-17 DIAGNOSIS — U071 COVID-19: Secondary | ICD-10-CM | POA: Diagnosis not present

## 2021-07-16 DIAGNOSIS — F411 Generalized anxiety disorder: Secondary | ICD-10-CM | POA: Diagnosis not present

## 2021-07-16 DIAGNOSIS — F330 Major depressive disorder, recurrent, mild: Secondary | ICD-10-CM | POA: Diagnosis not present

## 2021-08-13 DIAGNOSIS — F330 Major depressive disorder, recurrent, mild: Secondary | ICD-10-CM | POA: Diagnosis not present

## 2021-08-13 DIAGNOSIS — F411 Generalized anxiety disorder: Secondary | ICD-10-CM | POA: Diagnosis not present

## 2021-09-08 DIAGNOSIS — E871 Hypo-osmolality and hyponatremia: Secondary | ICD-10-CM | POA: Diagnosis not present

## 2021-09-17 DIAGNOSIS — F330 Major depressive disorder, recurrent, mild: Secondary | ICD-10-CM | POA: Diagnosis not present

## 2021-09-17 DIAGNOSIS — F411 Generalized anxiety disorder: Secondary | ICD-10-CM | POA: Diagnosis not present

## 2021-10-20 DIAGNOSIS — F411 Generalized anxiety disorder: Secondary | ICD-10-CM | POA: Diagnosis not present

## 2021-10-20 DIAGNOSIS — F330 Major depressive disorder, recurrent, mild: Secondary | ICD-10-CM | POA: Diagnosis not present

## 2021-11-12 DIAGNOSIS — F330 Major depressive disorder, recurrent, mild: Secondary | ICD-10-CM | POA: Diagnosis not present

## 2021-11-12 DIAGNOSIS — F411 Generalized anxiety disorder: Secondary | ICD-10-CM | POA: Diagnosis not present

## 2021-12-10 DIAGNOSIS — F330 Major depressive disorder, recurrent, mild: Secondary | ICD-10-CM | POA: Diagnosis not present

## 2021-12-10 DIAGNOSIS — F411 Generalized anxiety disorder: Secondary | ICD-10-CM | POA: Diagnosis not present

## 2021-12-15 DIAGNOSIS — E7800 Pure hypercholesterolemia, unspecified: Secondary | ICD-10-CM | POA: Diagnosis not present

## 2021-12-15 DIAGNOSIS — F329 Major depressive disorder, single episode, unspecified: Secondary | ICD-10-CM | POA: Diagnosis not present

## 2021-12-15 DIAGNOSIS — J309 Allergic rhinitis, unspecified: Secondary | ICD-10-CM | POA: Diagnosis not present

## 2021-12-15 DIAGNOSIS — N529 Male erectile dysfunction, unspecified: Secondary | ICD-10-CM | POA: Diagnosis not present

## 2021-12-23 DIAGNOSIS — H5213 Myopia, bilateral: Secondary | ICD-10-CM | POA: Diagnosis not present

## 2022-01-14 DIAGNOSIS — F330 Major depressive disorder, recurrent, mild: Secondary | ICD-10-CM | POA: Diagnosis not present

## 2022-01-14 DIAGNOSIS — F411 Generalized anxiety disorder: Secondary | ICD-10-CM | POA: Diagnosis not present

## 2022-02-11 DIAGNOSIS — F330 Major depressive disorder, recurrent, mild: Secondary | ICD-10-CM | POA: Diagnosis not present

## 2022-02-11 DIAGNOSIS — F411 Generalized anxiety disorder: Secondary | ICD-10-CM | POA: Diagnosis not present

## 2022-03-18 DIAGNOSIS — F411 Generalized anxiety disorder: Secondary | ICD-10-CM | POA: Diagnosis not present

## 2022-03-18 DIAGNOSIS — F330 Major depressive disorder, recurrent, mild: Secondary | ICD-10-CM | POA: Diagnosis not present

## 2022-04-14 DIAGNOSIS — E871 Hypo-osmolality and hyponatremia: Secondary | ICD-10-CM | POA: Diagnosis not present

## 2022-04-15 DIAGNOSIS — F411 Generalized anxiety disorder: Secondary | ICD-10-CM | POA: Diagnosis not present

## 2022-04-15 DIAGNOSIS — F330 Major depressive disorder, recurrent, mild: Secondary | ICD-10-CM | POA: Diagnosis not present

## 2022-05-11 DIAGNOSIS — H1045 Other chronic allergic conjunctivitis: Secondary | ICD-10-CM | POA: Diagnosis not present

## 2022-05-11 DIAGNOSIS — H2513 Age-related nuclear cataract, bilateral: Secondary | ICD-10-CM | POA: Diagnosis not present

## 2022-05-20 DIAGNOSIS — F411 Generalized anxiety disorder: Secondary | ICD-10-CM | POA: Diagnosis not present

## 2022-05-20 DIAGNOSIS — F330 Major depressive disorder, recurrent, mild: Secondary | ICD-10-CM | POA: Diagnosis not present

## 2022-06-22 DIAGNOSIS — Z0000 Encounter for general adult medical examination without abnormal findings: Secondary | ICD-10-CM | POA: Diagnosis not present

## 2022-06-22 DIAGNOSIS — E7800 Pure hypercholesterolemia, unspecified: Secondary | ICD-10-CM | POA: Diagnosis not present

## 2022-06-22 DIAGNOSIS — F329 Major depressive disorder, single episode, unspecified: Secondary | ICD-10-CM | POA: Diagnosis not present

## 2022-06-22 DIAGNOSIS — N529 Male erectile dysfunction, unspecified: Secondary | ICD-10-CM | POA: Diagnosis not present

## 2022-06-22 DIAGNOSIS — Z125 Encounter for screening for malignant neoplasm of prostate: Secondary | ICD-10-CM | POA: Diagnosis not present

## 2022-06-22 DIAGNOSIS — Z23 Encounter for immunization: Secondary | ICD-10-CM | POA: Diagnosis not present

## 2022-06-22 DIAGNOSIS — L659 Nonscarring hair loss, unspecified: Secondary | ICD-10-CM | POA: Diagnosis not present

## 2022-06-24 DIAGNOSIS — F411 Generalized anxiety disorder: Secondary | ICD-10-CM | POA: Diagnosis not present

## 2022-06-24 DIAGNOSIS — F330 Major depressive disorder, recurrent, mild: Secondary | ICD-10-CM | POA: Diagnosis not present

## 2022-07-14 DIAGNOSIS — F330 Major depressive disorder, recurrent, mild: Secondary | ICD-10-CM | POA: Diagnosis not present

## 2022-07-14 DIAGNOSIS — F411 Generalized anxiety disorder: Secondary | ICD-10-CM | POA: Diagnosis not present

## 2022-08-12 DIAGNOSIS — F330 Major depressive disorder, recurrent, mild: Secondary | ICD-10-CM | POA: Diagnosis not present

## 2022-08-12 DIAGNOSIS — F411 Generalized anxiety disorder: Secondary | ICD-10-CM | POA: Diagnosis not present

## 2022-10-14 DIAGNOSIS — F330 Major depressive disorder, recurrent, mild: Secondary | ICD-10-CM | POA: Diagnosis not present

## 2022-10-14 DIAGNOSIS — F411 Generalized anxiety disorder: Secondary | ICD-10-CM | POA: Diagnosis not present

## 2022-11-11 DIAGNOSIS — F330 Major depressive disorder, recurrent, mild: Secondary | ICD-10-CM | POA: Diagnosis not present

## 2022-11-11 DIAGNOSIS — F411 Generalized anxiety disorder: Secondary | ICD-10-CM | POA: Diagnosis not present

## 2022-12-09 DIAGNOSIS — F330 Major depressive disorder, recurrent, mild: Secondary | ICD-10-CM | POA: Diagnosis not present

## 2022-12-09 DIAGNOSIS — F411 Generalized anxiety disorder: Secondary | ICD-10-CM | POA: Diagnosis not present

## 2022-12-19 DIAGNOSIS — E871 Hypo-osmolality and hyponatremia: Secondary | ICD-10-CM | POA: Diagnosis not present

## 2022-12-19 DIAGNOSIS — F329 Major depressive disorder, single episode, unspecified: Secondary | ICD-10-CM | POA: Diagnosis not present

## 2022-12-19 DIAGNOSIS — J309 Allergic rhinitis, unspecified: Secondary | ICD-10-CM | POA: Diagnosis not present

## 2022-12-19 DIAGNOSIS — N529 Male erectile dysfunction, unspecified: Secondary | ICD-10-CM | POA: Diagnosis not present

## 2022-12-19 DIAGNOSIS — E7800 Pure hypercholesterolemia, unspecified: Secondary | ICD-10-CM | POA: Diagnosis not present

## 2023-01-06 DIAGNOSIS — F330 Major depressive disorder, recurrent, mild: Secondary | ICD-10-CM | POA: Diagnosis not present

## 2023-01-06 DIAGNOSIS — F411 Generalized anxiety disorder: Secondary | ICD-10-CM | POA: Diagnosis not present

## 2023-01-21 DIAGNOSIS — R0981 Nasal congestion: Secondary | ICD-10-CM | POA: Diagnosis not present

## 2023-01-21 DIAGNOSIS — R509 Fever, unspecified: Secondary | ICD-10-CM | POA: Diagnosis not present

## 2023-01-21 DIAGNOSIS — U071 COVID-19: Secondary | ICD-10-CM | POA: Diagnosis not present

## 2023-01-21 DIAGNOSIS — J029 Acute pharyngitis, unspecified: Secondary | ICD-10-CM | POA: Diagnosis not present

## 2023-01-29 DIAGNOSIS — J029 Acute pharyngitis, unspecified: Secondary | ICD-10-CM | POA: Diagnosis not present

## 2023-01-29 DIAGNOSIS — U071 COVID-19: Secondary | ICD-10-CM | POA: Diagnosis not present

## 2023-01-29 DIAGNOSIS — J208 Acute bronchitis due to other specified organisms: Secondary | ICD-10-CM | POA: Diagnosis not present

## 2023-02-24 DIAGNOSIS — F411 Generalized anxiety disorder: Secondary | ICD-10-CM | POA: Diagnosis not present

## 2023-02-24 DIAGNOSIS — F330 Major depressive disorder, recurrent, mild: Secondary | ICD-10-CM | POA: Diagnosis not present

## 2023-03-17 DIAGNOSIS — F330 Major depressive disorder, recurrent, mild: Secondary | ICD-10-CM | POA: Diagnosis not present

## 2023-03-17 DIAGNOSIS — F411 Generalized anxiety disorder: Secondary | ICD-10-CM | POA: Diagnosis not present

## 2023-04-14 DIAGNOSIS — F411 Generalized anxiety disorder: Secondary | ICD-10-CM | POA: Diagnosis not present

## 2023-04-14 DIAGNOSIS — F330 Major depressive disorder, recurrent, mild: Secondary | ICD-10-CM | POA: Diagnosis not present

## 2023-05-17 DIAGNOSIS — H2513 Age-related nuclear cataract, bilateral: Secondary | ICD-10-CM | POA: Diagnosis not present

## 2023-05-17 DIAGNOSIS — H1045 Other chronic allergic conjunctivitis: Secondary | ICD-10-CM | POA: Diagnosis not present

## 2023-05-26 DIAGNOSIS — F411 Generalized anxiety disorder: Secondary | ICD-10-CM | POA: Diagnosis not present

## 2023-05-26 DIAGNOSIS — F330 Major depressive disorder, recurrent, mild: Secondary | ICD-10-CM | POA: Diagnosis not present

## 2023-06-09 DIAGNOSIS — F411 Generalized anxiety disorder: Secondary | ICD-10-CM | POA: Diagnosis not present

## 2023-06-09 DIAGNOSIS — F330 Major depressive disorder, recurrent, mild: Secondary | ICD-10-CM | POA: Diagnosis not present

## 2023-06-30 DIAGNOSIS — Z0000 Encounter for general adult medical examination without abnormal findings: Secondary | ICD-10-CM | POA: Diagnosis not present

## 2023-06-30 DIAGNOSIS — E7800 Pure hypercholesterolemia, unspecified: Secondary | ICD-10-CM | POA: Diagnosis not present

## 2023-06-30 DIAGNOSIS — Z23 Encounter for immunization: Secondary | ICD-10-CM | POA: Diagnosis not present

## 2023-06-30 DIAGNOSIS — Z125 Encounter for screening for malignant neoplasm of prostate: Secondary | ICD-10-CM | POA: Diagnosis not present

## 2023-06-30 DIAGNOSIS — N529 Male erectile dysfunction, unspecified: Secondary | ICD-10-CM | POA: Diagnosis not present

## 2023-06-30 DIAGNOSIS — E871 Hypo-osmolality and hyponatremia: Secondary | ICD-10-CM | POA: Diagnosis not present

## 2023-07-06 DIAGNOSIS — Z23 Encounter for immunization: Secondary | ICD-10-CM | POA: Diagnosis not present

## 2023-07-07 DIAGNOSIS — F411 Generalized anxiety disorder: Secondary | ICD-10-CM | POA: Diagnosis not present

## 2023-07-07 DIAGNOSIS — F330 Major depressive disorder, recurrent, mild: Secondary | ICD-10-CM | POA: Diagnosis not present

## 2023-08-11 DIAGNOSIS — F330 Major depressive disorder, recurrent, mild: Secondary | ICD-10-CM | POA: Diagnosis not present

## 2023-08-11 DIAGNOSIS — F411 Generalized anxiety disorder: Secondary | ICD-10-CM | POA: Diagnosis not present

## 2023-09-15 DIAGNOSIS — F411 Generalized anxiety disorder: Secondary | ICD-10-CM | POA: Diagnosis not present

## 2023-09-15 DIAGNOSIS — F330 Major depressive disorder, recurrent, mild: Secondary | ICD-10-CM | POA: Diagnosis not present

## 2023-10-13 DIAGNOSIS — F411 Generalized anxiety disorder: Secondary | ICD-10-CM | POA: Diagnosis not present

## 2023-10-13 DIAGNOSIS — F330 Major depressive disorder, recurrent, mild: Secondary | ICD-10-CM | POA: Diagnosis not present

## 2023-11-17 DIAGNOSIS — F330 Major depressive disorder, recurrent, mild: Secondary | ICD-10-CM | POA: Diagnosis not present

## 2023-11-17 DIAGNOSIS — F411 Generalized anxiety disorder: Secondary | ICD-10-CM | POA: Diagnosis not present

## 2023-12-08 DIAGNOSIS — F330 Major depressive disorder, recurrent, mild: Secondary | ICD-10-CM | POA: Diagnosis not present

## 2023-12-08 DIAGNOSIS — F411 Generalized anxiety disorder: Secondary | ICD-10-CM | POA: Diagnosis not present

## 2023-12-20 DIAGNOSIS — N529 Male erectile dysfunction, unspecified: Secondary | ICD-10-CM | POA: Diagnosis not present

## 2023-12-20 DIAGNOSIS — E7800 Pure hypercholesterolemia, unspecified: Secondary | ICD-10-CM | POA: Diagnosis not present

## 2023-12-20 DIAGNOSIS — J309 Allergic rhinitis, unspecified: Secondary | ICD-10-CM | POA: Diagnosis not present

## 2023-12-20 DIAGNOSIS — E871 Hypo-osmolality and hyponatremia: Secondary | ICD-10-CM | POA: Diagnosis not present

## 2023-12-20 DIAGNOSIS — Z0184 Encounter for antibody response examination: Secondary | ICD-10-CM | POA: Diagnosis not present

## 2023-12-20 DIAGNOSIS — L659 Nonscarring hair loss, unspecified: Secondary | ICD-10-CM | POA: Diagnosis not present

## 2024-01-01 DIAGNOSIS — Z23 Encounter for immunization: Secondary | ICD-10-CM | POA: Diagnosis not present

## 2024-01-05 DIAGNOSIS — F330 Major depressive disorder, recurrent, mild: Secondary | ICD-10-CM | POA: Diagnosis not present

## 2024-01-05 DIAGNOSIS — F411 Generalized anxiety disorder: Secondary | ICD-10-CM | POA: Diagnosis not present

## 2024-02-02 DIAGNOSIS — F330 Major depressive disorder, recurrent, mild: Secondary | ICD-10-CM | POA: Diagnosis not present

## 2024-02-02 DIAGNOSIS — F411 Generalized anxiety disorder: Secondary | ICD-10-CM | POA: Diagnosis not present

## 2024-02-02 DIAGNOSIS — H5213 Myopia, bilateral: Secondary | ICD-10-CM | POA: Diagnosis not present

## 2024-03-22 DIAGNOSIS — H9312 Tinnitus, left ear: Secondary | ICD-10-CM | POA: Diagnosis not present

## 2024-03-22 DIAGNOSIS — H6122 Impacted cerumen, left ear: Secondary | ICD-10-CM | POA: Diagnosis not present

## 2024-03-28 DIAGNOSIS — D120 Benign neoplasm of cecum: Secondary | ICD-10-CM | POA: Diagnosis not present

## 2024-03-28 DIAGNOSIS — Z860100 Personal history of colon polyps, unspecified: Secondary | ICD-10-CM | POA: Diagnosis not present

## 2024-03-28 DIAGNOSIS — Z09 Encounter for follow-up examination after completed treatment for conditions other than malignant neoplasm: Secondary | ICD-10-CM | POA: Diagnosis not present

## 2024-03-28 DIAGNOSIS — D122 Benign neoplasm of ascending colon: Secondary | ICD-10-CM | POA: Diagnosis not present

## 2024-03-28 DIAGNOSIS — K5730 Diverticulosis of large intestine without perforation or abscess without bleeding: Secondary | ICD-10-CM | POA: Diagnosis not present

## 2024-03-29 DIAGNOSIS — H9319 Tinnitus, unspecified ear: Secondary | ICD-10-CM | POA: Diagnosis not present

## 2024-03-29 DIAGNOSIS — F411 Generalized anxiety disorder: Secondary | ICD-10-CM | POA: Diagnosis not present

## 2024-03-29 DIAGNOSIS — F330 Major depressive disorder, recurrent, mild: Secondary | ICD-10-CM | POA: Diagnosis not present

## 2024-04-11 DIAGNOSIS — J069 Acute upper respiratory infection, unspecified: Secondary | ICD-10-CM | POA: Diagnosis not present

## 2024-04-18 DIAGNOSIS — H9313 Tinnitus, bilateral: Secondary | ICD-10-CM | POA: Diagnosis not present

## 2024-04-18 DIAGNOSIS — H903 Sensorineural hearing loss, bilateral: Secondary | ICD-10-CM | POA: Diagnosis not present

## 2024-04-19 DIAGNOSIS — K4090 Unilateral inguinal hernia, without obstruction or gangrene, not specified as recurrent: Secondary | ICD-10-CM | POA: Diagnosis not present

## 2024-04-26 DIAGNOSIS — F411 Generalized anxiety disorder: Secondary | ICD-10-CM | POA: Diagnosis not present

## 2024-04-26 DIAGNOSIS — F330 Major depressive disorder, recurrent, mild: Secondary | ICD-10-CM | POA: Diagnosis not present

## 2024-05-08 DIAGNOSIS — K4090 Unilateral inguinal hernia, without obstruction or gangrene, not specified as recurrent: Secondary | ICD-10-CM | POA: Diagnosis not present

## 2024-05-17 DIAGNOSIS — F330 Major depressive disorder, recurrent, mild: Secondary | ICD-10-CM | POA: Diagnosis not present

## 2024-05-17 DIAGNOSIS — F411 Generalized anxiety disorder: Secondary | ICD-10-CM | POA: Diagnosis not present

## 2024-05-20 DIAGNOSIS — H25812 Combined forms of age-related cataract, left eye: Secondary | ICD-10-CM | POA: Diagnosis not present

## 2024-05-20 DIAGNOSIS — H1045 Other chronic allergic conjunctivitis: Secondary | ICD-10-CM | POA: Diagnosis not present

## 2024-05-20 DIAGNOSIS — H5213 Myopia, bilateral: Secondary | ICD-10-CM | POA: Diagnosis not present

## 2024-05-20 DIAGNOSIS — H16223 Keratoconjunctivitis sicca, not specified as Sjogren's, bilateral: Secondary | ICD-10-CM | POA: Diagnosis not present

## 2024-06-11 DIAGNOSIS — H25812 Combined forms of age-related cataract, left eye: Secondary | ICD-10-CM | POA: Diagnosis not present

## 2024-06-19 DIAGNOSIS — H2511 Age-related nuclear cataract, right eye: Secondary | ICD-10-CM | POA: Diagnosis not present

## 2024-06-21 DIAGNOSIS — F330 Major depressive disorder, recurrent, mild: Secondary | ICD-10-CM | POA: Diagnosis not present

## 2024-06-21 DIAGNOSIS — F411 Generalized anxiety disorder: Secondary | ICD-10-CM | POA: Diagnosis not present

## 2024-06-25 DIAGNOSIS — H25811 Combined forms of age-related cataract, right eye: Secondary | ICD-10-CM | POA: Diagnosis not present

## 2024-07-12 DIAGNOSIS — F330 Major depressive disorder, recurrent, mild: Secondary | ICD-10-CM | POA: Diagnosis not present

## 2024-07-12 DIAGNOSIS — F411 Generalized anxiety disorder: Secondary | ICD-10-CM | POA: Diagnosis not present

## 2024-07-15 DIAGNOSIS — N529 Male erectile dysfunction, unspecified: Secondary | ICD-10-CM | POA: Diagnosis not present

## 2024-07-15 DIAGNOSIS — E7800 Pure hypercholesterolemia, unspecified: Secondary | ICD-10-CM | POA: Diagnosis not present

## 2024-07-15 DIAGNOSIS — F3341 Major depressive disorder, recurrent, in partial remission: Secondary | ICD-10-CM | POA: Diagnosis not present

## 2024-07-15 DIAGNOSIS — Z23 Encounter for immunization: Secondary | ICD-10-CM | POA: Diagnosis not present

## 2024-07-15 DIAGNOSIS — L659 Nonscarring hair loss, unspecified: Secondary | ICD-10-CM | POA: Diagnosis not present

## 2024-07-15 DIAGNOSIS — N401 Enlarged prostate with lower urinary tract symptoms: Secondary | ICD-10-CM | POA: Diagnosis not present

## 2024-07-15 DIAGNOSIS — Z0000 Encounter for general adult medical examination without abnormal findings: Secondary | ICD-10-CM | POA: Diagnosis not present

## 2024-08-09 DIAGNOSIS — F330 Major depressive disorder, recurrent, mild: Secondary | ICD-10-CM | POA: Diagnosis not present

## 2024-08-09 DIAGNOSIS — F411 Generalized anxiety disorder: Secondary | ICD-10-CM | POA: Diagnosis not present

## 2024-08-17 DIAGNOSIS — M79644 Pain in right finger(s): Secondary | ICD-10-CM | POA: Diagnosis not present

## 2024-09-08 ENCOUNTER — Emergency Department (HOSPITAL_BASED_OUTPATIENT_CLINIC_OR_DEPARTMENT_OTHER)
Admission: EM | Admit: 2024-09-08 | Discharge: 2024-09-08 | Disposition: A | Discharge status: Home / Self Care | Attending: Emergency Medicine | Admitting: Emergency Medicine

## 2024-09-08 ENCOUNTER — Other Ambulatory Visit: Payer: Self-pay

## 2024-09-08 ENCOUNTER — Emergency Department (HOSPITAL_BASED_OUTPATIENT_CLINIC_OR_DEPARTMENT_OTHER)

## 2024-09-08 ENCOUNTER — Encounter (HOSPITAL_BASED_OUTPATIENT_CLINIC_OR_DEPARTMENT_OTHER): Payer: Self-pay

## 2024-09-08 DIAGNOSIS — K5732 Diverticulitis of large intestine without perforation or abscess without bleeding: Secondary | ICD-10-CM | POA: Diagnosis not present

## 2024-09-08 DIAGNOSIS — K659 Peritonitis, unspecified: Secondary | ICD-10-CM | POA: Insufficient documentation

## 2024-09-08 DIAGNOSIS — K6389 Other specified diseases of intestine: Secondary | ICD-10-CM

## 2024-09-08 DIAGNOSIS — K5792 Diverticulitis of intestine, part unspecified, without perforation or abscess without bleeding: Secondary | ICD-10-CM

## 2024-09-08 DIAGNOSIS — R1032 Left lower quadrant pain: Secondary | ICD-10-CM | POA: Diagnosis present

## 2024-09-08 LAB — COMPREHENSIVE METABOLIC PANEL WITH GFR
ALT: 29 U/L (ref 0–44)
AST: 24 U/L (ref 15–41)
Albumin: 4.9 g/dL (ref 3.5–5.0)
Alkaline Phosphatase: 92 U/L (ref 38–126)
Anion gap: 13 (ref 5–15)
BUN: 17 mg/dL (ref 8–23)
CO2: 25 mmol/L (ref 22–32)
Calcium: 10.2 mg/dL (ref 8.9–10.3)
Chloride: 98 mmol/L (ref 98–111)
Creatinine, Ser: 0.9 mg/dL (ref 0.61–1.24)
GFR, Estimated: >60 mL/min
Glucose, Bld: 86 mg/dL (ref 70–99)
Potassium: 4.5 mmol/L (ref 3.5–5.1)
Sodium: 135 mmol/L (ref 135–145)
Total Bilirubin: 0.6 mg/dL (ref 0.0–1.2)
Total Protein: 7.5 g/dL (ref 6.5–8.1)

## 2024-09-08 LAB — URINALYSIS, ROUTINE W REFLEX MICROSCOPIC
Bacteria, UA: NONE SEEN
Bilirubin Urine: NEGATIVE
Glucose, UA: NEGATIVE mg/dL
Hgb urine dipstick: NEGATIVE
Ketones, ur: NEGATIVE mg/dL
Leukocytes,Ua: NEGATIVE
Nitrite: NEGATIVE
Protein, ur: NEGATIVE mg/dL
Specific Gravity, Urine: 1.015 (ref 1.005–1.030)
pH: 6 (ref 5.0–8.0)

## 2024-09-08 LAB — CBC
HCT: 51.2 % (ref 39.0–52.0)
Hemoglobin: 17.8 g/dL — ABNORMAL HIGH (ref 13.0–17.0)
MCH: 33 pg (ref 26.0–34.0)
MCHC: 34.8 g/dL (ref 30.0–36.0)
MCV: 94.8 fL (ref 80.0–100.0)
Platelets: 162 K/uL (ref 150–400)
RBC: 5.4 MIL/uL (ref 4.22–5.81)
RDW: 11.9 % (ref 11.5–15.5)
WBC: 9 K/uL (ref 4.0–10.5)
nRBC: 0 % (ref 0.0–0.2)

## 2024-09-08 LAB — LIPASE, BLOOD: Lipase: 31 U/L (ref 11–51)

## 2024-09-08 MED ORDER — AMOXICILLIN-POT CLAVULANATE 875-125 MG PO TABS: 1.0000 | ORAL_TABLET | Freq: Three times a day (TID) | ORAL | 0 refills | Status: AC

## 2024-09-08 MED ORDER — IOHEXOL 350 MG/ML SOLN
80.0000 mL | Freq: Once | INTRAVENOUS | Status: AC | PRN
  Administered 2024-09-08: 80 mL via INTRAVENOUS

## 2024-09-08 MED ORDER — OXYCODONE-ACETAMINOPHEN 5-325 MG PO TABS: 1.0000 | ORAL_TABLET | Freq: Four times a day (QID) | ORAL | 0 refills | Status: AC | PRN

## 2024-09-08 MED ORDER — MORPHINE SULFATE (PF) 4 MG/ML IV SOLN
4.0000 mg | Freq: Once | INTRAVENOUS | Status: AC
  Administered 2024-09-08: 4 mg via INTRAVENOUS
  Filled 2024-09-08: qty 1

## 2024-09-08 NOTE — ED Triage Notes (Signed)
 Pt c/o abd pain LLQ abd pain onset last night approx 9p, worsening today. No NVD/C.  Hx diverticulitis, states pain was dramatically different, but still concerned for same. States that brother's appendix is on L side, want to make sure to find that out too.

## 2024-09-08 NOTE — ED Provider Notes (Signed)
 " [place] [facility] AT [facility] Provider Note   CSN: [id number] Arrival date & time: 09/08/24  1458     Patient presents with: Abdominal Pain   Tyler Hickman is a 64 y.o. male.   64 year old male presenting with left lower quadrant abdominal pain.  Pain began last night around 8-9p, describes pain as intermittently sharp versus sore that is worse on palpation.  No change in bowel habits since onset of pain, had a bowel movement this morning that was normal for him, no diarrhea or melena/hematochezia.  No nausea/vomiting.  History of multiple episodes of diverticulitis 8+ years ago, reports that his brother had a history of a left-sided appendicitis as well and is concerned that his appendix may be on the left side.  No fever at home.  No chest pain or shortness of breath.   Abdominal Pain      Prior to Admission medications  Medication Sig Start Date End Date Taking? Authorizing Provider  ALPRAZolam (XANAX) 0.5 MG tablet Take 0.5 mg by mouth 2 (two) times daily as needed for anxiety (sleep).     [provider]  buPROPion (WELLBUTRIN XL) 150 MG 24 hr tablet Take 300 mg by mouth daily.     [provider]  Calcium Carbonate (CALCIUM 600 PO) Take 2 tablets by mouth daily.    [provider]  cetirizine (ZYRTEC) 10 MG tablet Take 10 mg by mouth daily.    [provider]  Chlorpheniramine-DM (ROBITUSSIN COUGH/COLD LONG-ACT) 2-15 MG/5ML LIQD Take by mouth as directed. Take by mouth as directed    [provider]  ciprofloxacin  (CIPRO ) 500 MG tablet Take 1 tablet (500 mg total) by mouth every 12 (twelve) hours. 02/19/17   Pisciotta, Nat, PA-C  cyclobenzaprine (FLEXERIL) 10 MG tablet Take 10 mg by mouth 3 (three) times daily as needed for muscle spasms (muscle spasms).    [provider]  docusate sodium (COLACE) 100 MG capsule Take 100 mg by mouth 2 (two) times daily.    [provider]  fluticasone  (FLONASE) 50 MCG/ACT nasal spray Place 2 sprays into both nostrils daily.    [provider]  guaiFENesin (MUCINEX) 600 MG 12 hr tablet Take 1,200 mg by mouth 2 (two) times daily as needed (cough).    [provider]  ibuprofen (ADVIL,MOTRIN) 100 MG/5ML suspension Take 200 mg by mouth every 8 (eight) hours as needed (migraine).    [provider]  ketotifen (ALAWAY) 0.025 % ophthalmic solution Apply 1 drop to eye 2 (two) times daily.    [provider]  montelukast (SINGULAIR) 10 MG tablet Take 10 mg by mouth at bedtime.    [provider]  Multiple Vitamin (MULTI VITAMIN DAILY PO) Take 2 tablets by mouth daily.    [provider]  naproxen sodium (ANAPROX) 220 MG tablet Take 220 mg by mouth 2 (two) times daily as needed (pain).    [provider]  ondansetron  (ZOFRAN ) 4 MG tablet Take 4 mg by mouth every 8 (eight) hours as needed for nausea or vomiting (migraine nausea).    [provider]  oxyCODONE -acetaminophen  (PERCOCET) 5-325 MG tablet Take 1 tablet by mouth every 4 (four) hours as needed. 02/19/17   Pisciotta, Nat, PA-C  oxymetazoline (AFRIN) 0.05 % nasal spray Place 3 sprays into both nostrils 2 (two) times daily as needed for congestion (congestion).    [provider]  polyethylene glycol (MIRALAX / GLYCOLAX) packet Take 17 g by mouth  daily as needed (constipation).    [provider]  pseudoephedrine (SUDAFED) 30 MG tablet Take 30 mg by mouth every 6 (six) hours as needed for congestion (congestion).    [provider]  sildenafil (VIAGRA) 50 MG tablet Take 50 mg by mouth daily as needed for erectile dysfunction (erectile dysfunction).    [provider]  Sodium Chloride-Sodium Bicarb (AYR SALINE NASAL RINSE) 1.57 G PACK Place into the nose daily as needed (nasal irrigation).    [provider]  zaleplon (SONATA) 10 MG capsule Take 10 mg by mouth at bedtime as needed for  sleep (insomnia of midnight awakenings).    [provider]    Allergies: Patient has no known allergies.    Review of Systems  Gastrointestinal:  Positive for abdominal pain.    Updated Vital Signs  Vitals:   09/08/24 1504  BP: (!) 140/84  Pulse: (!) 58  Resp: 16  Temp: 97.7 F (36.5 C)  TempSrc: Oral  SpO2: 100%     Physical Exam Vitals and nursing note reviewed.  HENT:     Head: Normocephalic.  Eyes:     Extraocular Movements: Extraocular movements intact.  Cardiovascular:     Rate and Rhythm: Normal rate and regular rhythm.     Heart sounds: Normal heart sounds.  Pulmonary:     Effort: Pulmonary effort is normal.     Breath sounds: Normal breath sounds.  Abdominal:     General: Bowel sounds are normal.     Palpations: Abdomen is soft.     Tenderness: There is abdominal tenderness (LLQ is exquisitely tender). There is guarding.  Musculoskeletal:     Cervical back: Normal range of motion.     Right lower leg: No edema.     Left lower leg: No edema.     Comments: Moves all extremities spontaneously without difficulty  Skin:    General: Skin is warm and dry.  Neurological:     Mental Status: He is alert and oriented to person, place, and time.     (all labs ordered are listed, but only abnormal results are displayed) Labs Reviewed  CBC - Abnormal; Notable for the following components:      Result Value   Hemoglobin 17.8 (*)    All other components within normal limits  LIPASE, BLOOD  COMPREHENSIVE METABOLIC PANEL WITH GFR  URINALYSIS, ROUTINE W REFLEX MICROSCOPIC    EKG: None  Radiology: CT ABDOMEN PELVIS W CONTRAST Result Date: 09/08/2024 CLINICAL DATA:  Left lower quadrant pain. History of diverticulitis. EXAM: CT ABDOMEN AND PELVIS WITH CONTRAST TECHNIQUE: Multidetector CT imaging of the abdomen and pelvis was performed using the standard protocol following bolus administration of intravenous contrast. RADIATION DOSE REDUCTION: This exam  was performed according to the departmental dose-optimization program which includes automated exposure control, adjustment of the mA and/or kV according to patient size and/or use of iterative reconstruction technique. CONTRAST:  80mL OMNIPAQUE  IOHEXOL  350 MG/ML SOLN COMPARISON:  02/19/2017. FINDINGS: Lower chest: Atelectasis is noted at the lung bases. Hepatobiliary: No focal liver abnormality is seen. No gallstones, gallbladder wall thickening, or biliary dilatation. Pancreas: Unremarkable. No pancreatic ductal dilatation or surrounding inflammatory changes. Spleen: Normal in size without focal abnormality. Adrenals/Urinary Tract: No adrenal nodule or mass. The kidneys enhance symmetrically. No renal or ureteral calculus or obstructive uropathy. The bladder is not well seen due to hardware artifact. Stomach/Bowel: The stomach is within normal limits. No bowel obstruction, free air, or pneumatosis is seen. Scattered diverticula  are present along the colon. There is bowel wall thickening with surrounding inflammatory changes of the distal descending colon in the left lower quadrant. In this region, there is a rim enhancing fat attenuation lesion measuring 2.9 cm, suggesting superimposed epiploic appendagitis. Appendix appears normal. Vascular/Lymphatic: Aortic atherosclerosis. No enlarged abdominal or pelvic lymph nodes. Reproductive: Prostate gland is not well seen due to metallic streak artifact. Other: No abdominopelvic ascites. Musculoskeletal: Bilateral total hip arthroplasty changes are noted. Mild degenerative changes are present in the lumbar spine. No acute osseous abnormality. IMPRESSION: 1. Acute diverticulitis involving the distal descending colon with superimposed epiploic appendagitis in the left lower quadrant. 2. Aortic atherosclerosis. Electronically Signed   By: Leita Birmingham M.D.   On: 09/08/2024 17:45     Procedures   Medications Ordered in the ED  morphine  (PF) 4 MG/ML injection 4 mg (4  mg Intravenous Given 09/08/24 1656)  iohexol  (OMNIPAQUE ) 350 MG/ML injection 80 mL (80 mLs Intravenous Contrast Given 09/08/24 1717)                                    Medical Decision Making This patient presents to the ED for concern of abdominal pain, this involves an extensive number of treatment options, and is a complaint that carries with it a high risk of complications and morbidity.  The differential diagnosis includes diverticulitis, appendicitis, pancreatitis, gallbladder pathology, gastroenteritis, colitis, constipation   Co morbidities that complicate the patient evaluation  History of diverticulitis  Lab Tests:  I Ordered, and personally interpreted labs.  The pertinent results include: CBC largely unremarkable, hemoglobin minimally elevated at 17.8.  CMP within normal limits.  Lipase normal, 31.  Urinalysis within normal limits.    Imaging Studies ordered:  I ordered imaging studies including CT abdomen/pelvis I independently visualized and interpreted imaging which showed 1. Acute diverticulitis involving the distal descending colon with superimposed epiploic appendagitis in the left lower quadrant. 2. Aortic atherosclerosis.  I agree with the radiologist interpretation   Cardiac Monitoring: / EKG:  The patient was maintained on a cardiac monitor.  I personally viewed and interpreted the cardiac monitored which showed an underlying rhythm of: Sinus bradycardia   Problem List / ED Course / Critical interventions / Medication management  I ordered medication including morphine  for pain Reevaluation of the patient after these medicines showed that the patient improved I have reviewed the patients home medicines and have made adjustments as needed   Test / Admission - Considered:  Physical exam notable as above, patient is exquisitely tender in the left lower quadrant, initial labs are reassuring as patient does not demonstrate a leukocytosis, his vitals are stable  with sinus bradycardia and he is afebrile.  Will proceed with CT abdomen/pelvis given personal history of diverticulitis. CT scan findings consistent with acute diverticulitis as well as epiploic appendagitis.  Patient is well-appearing and in no acute distress, given reassuring findings as noted above I do feel that he is appropriate for outpatient management, will start patient on course of Augmentin  with plan for close PCP follow-up.  Will prescribe Percocet to be used as needed for breakthrough pain as well.  Patient understands that he needs to return to the emergency department if his symptoms worsen or if he develops a fever, he is in agreement with this plan and is appropriate for discharge at this time. Staffed with Dr. Ruthe    Amount and/or Complexity of Data  Reviewed Labs: ordered. Radiology: ordered.  Risk Prescription drug management.        Final diagnoses:  Diverticulitis  Epiploic appendagitis    ED Discharge Orders          Ordered    amoxicillin -clavulanate (AUGMENTIN ) 875-125 MG tablet  Every 8 hours        09/08/24 1821    oxyCODONE -acetaminophen  (PERCOCET/ROXICET) 5-325 MG tablet  Every 6 hours PRN        09/08/24 1821               Glendia Rocky SAILOR, PA-C 09/08/24 1823    Ruthe Cornet, DO 09/08/24 2224  "

## 2024-09-08 NOTE — Discharge Instructions (Addendum)
 Your CT scan today shows evidence of diverticulitis and epiploic appendagitis.  Start Augmentin , take 1 tablet by mouth every 8 hours for 5 days.  Contact your PCP to schedule follow-up this week.  Start Percocet and use every 6 hours as needed for breakthrough pain, please be aware this medication may cause fatigue/drowsiness.  Return the emergency department if your son comes worsen or if you develop a fever.
